# Patient Record
Sex: Female | Born: 1955
Health system: Southern US, Community
[De-identification: ages and names within clinical notes are randomized; demographics above are authoritative.]

## PROBLEM LIST (undated history)

## (undated) DIAGNOSIS — K5792 Diverticulitis of intestine, part unspecified, without perforation or abscess without bleeding: Secondary | ICD-10-CM

## (undated) DIAGNOSIS — E785 Hyperlipidemia, unspecified: Secondary | ICD-10-CM

## (undated) DIAGNOSIS — I10 Essential (primary) hypertension: Secondary | ICD-10-CM

## (undated) DIAGNOSIS — T7840XA Allergy, unspecified, initial encounter: Secondary | ICD-10-CM

## (undated) DIAGNOSIS — M1990 Unspecified osteoarthritis, unspecified site: Secondary | ICD-10-CM

## (undated) DIAGNOSIS — Z860101 Personal history of adenomatous and serrated colon polyps: Secondary | ICD-10-CM

## (undated) DIAGNOSIS — J45909 Unspecified asthma, uncomplicated: Secondary | ICD-10-CM

## (undated) DIAGNOSIS — Z86010 Personal history of colonic polyps: Secondary | ICD-10-CM

## (undated) HISTORY — DX: Essential (primary) hypertension: I10

## (undated) HISTORY — DX: Allergy, unspecified, initial encounter: T78.40XA

## (undated) HISTORY — DX: Diverticulitis of intestine, part unspecified, without perforation or abscess without bleeding: K57.92

## (undated) HISTORY — DX: Unspecified osteoarthritis, unspecified site: M19.90

## (undated) HISTORY — PX: APPENDECTOMY: SHX54

## (undated) HISTORY — PX: TUBAL LIGATION: SHX77

## (undated) HISTORY — DX: Unspecified asthma, uncomplicated: J45.909

## (undated) HISTORY — DX: Hyperlipidemia, unspecified: E78.5

## (undated) HISTORY — DX: Personal history of colonic polyps: Z86.010

## (undated) HISTORY — DX: Personal history of adenomatous and serrated colon polyps: Z86.0101

## (undated) HISTORY — PX: OTHER SURGICAL HISTORY: SHX169

---

## 1993-12-13 HISTORY — PX: ANTERIOR CRUCIATE LIGAMENT REPAIR: SHX115

## 2003-12-14 HISTORY — PX: BREAST BIOPSY: SHX20

## 2004-09-07 ENCOUNTER — Encounter: Admission: RE | Admit: 2004-09-07 | Discharge: 2004-09-07 | Payer: Self-pay | Admitting: Obstetrics and Gynecology

## 2004-09-10 ENCOUNTER — Encounter: Admission: RE | Admit: 2004-09-10 | Discharge: 2004-09-10 | Payer: Self-pay | Admitting: Obstetrics and Gynecology

## 2004-09-14 ENCOUNTER — Encounter (INDEPENDENT_AMBULATORY_CARE_PROVIDER_SITE_OTHER): Payer: Self-pay | Admitting: *Deleted

## 2004-09-14 ENCOUNTER — Encounter: Admission: RE | Admit: 2004-09-14 | Discharge: 2004-09-14 | Payer: Self-pay | Admitting: Obstetrics and Gynecology

## 2005-10-12 ENCOUNTER — Encounter: Admission: RE | Admit: 2005-10-12 | Discharge: 2005-10-12 | Payer: Self-pay | Admitting: Obstetrics and Gynecology

## 2006-10-13 ENCOUNTER — Encounter: Admission: RE | Admit: 2006-10-13 | Discharge: 2006-10-13 | Payer: Self-pay | Admitting: Obstetrics and Gynecology

## 2007-09-10 ENCOUNTER — Emergency Department (HOSPITAL_COMMUNITY): Admission: EM | Admit: 2007-09-10 | Discharge: 2007-09-10 | Payer: Self-pay | Admitting: Emergency Medicine

## 2007-10-18 ENCOUNTER — Encounter: Admission: RE | Admit: 2007-10-18 | Discharge: 2007-10-18 | Payer: Self-pay | Admitting: Obstetrics and Gynecology

## 2008-05-31 ENCOUNTER — Encounter: Admission: RE | Admit: 2008-05-31 | Discharge: 2008-05-31 | Payer: Self-pay | Admitting: Gastroenterology

## 2008-10-23 ENCOUNTER — Encounter: Admission: RE | Admit: 2008-10-23 | Discharge: 2008-10-23 | Payer: Self-pay | Admitting: Obstetrics and Gynecology

## 2009-04-15 ENCOUNTER — Encounter: Payer: Self-pay | Admitting: Internal Medicine

## 2009-05-05 ENCOUNTER — Ambulatory Visit: Payer: Self-pay | Admitting: Internal Medicine

## 2009-05-05 DIAGNOSIS — E785 Hyperlipidemia, unspecified: Secondary | ICD-10-CM | POA: Insufficient documentation

## 2009-05-05 DIAGNOSIS — J309 Allergic rhinitis, unspecified: Secondary | ICD-10-CM | POA: Insufficient documentation

## 2009-05-05 DIAGNOSIS — I10 Essential (primary) hypertension: Secondary | ICD-10-CM | POA: Insufficient documentation

## 2009-05-05 DIAGNOSIS — J45909 Unspecified asthma, uncomplicated: Secondary | ICD-10-CM | POA: Insufficient documentation

## 2009-05-05 DIAGNOSIS — Z860100 Personal history of colon polyps, unspecified: Secondary | ICD-10-CM | POA: Insufficient documentation

## 2009-05-05 LAB — CONVERTED CEMR LAB
Cholesterol, target level: 200 mg/dL
HDL goal, serum: 40 mg/dL
LDL Goal: 160 mg/dL

## 2009-10-30 ENCOUNTER — Encounter: Admission: RE | Admit: 2009-10-30 | Discharge: 2009-10-30 | Payer: Self-pay | Admitting: Internal Medicine

## 2009-12-18 ENCOUNTER — Telehealth (INDEPENDENT_AMBULATORY_CARE_PROVIDER_SITE_OTHER): Payer: Self-pay | Admitting: *Deleted

## 2010-03-17 ENCOUNTER — Ambulatory Visit: Payer: Self-pay | Admitting: Internal Medicine

## 2010-03-17 DIAGNOSIS — K5730 Diverticulosis of large intestine without perforation or abscess without bleeding: Secondary | ICD-10-CM | POA: Insufficient documentation

## 2010-03-24 ENCOUNTER — Ambulatory Visit: Payer: Self-pay | Admitting: Internal Medicine

## 2010-11-03 ENCOUNTER — Encounter: Admission: RE | Admit: 2010-11-03 | Discharge: 2010-11-03 | Payer: Self-pay | Admitting: Internal Medicine

## 2011-01-07 ENCOUNTER — Other Ambulatory Visit: Payer: Self-pay | Admitting: Obstetrics and Gynecology

## 2011-01-10 LAB — CONVERTED CEMR LAB
ALT: 44 units/L — ABNORMAL HIGH (ref 0–35)
AST: 26 units/L (ref 0–37)
Albumin: 4 g/dL (ref 3.5–5.2)
Alkaline Phosphatase: 47 units/L (ref 39–117)
BUN: 10 mg/dL (ref 6–23)
Basophils Absolute: 0 10*3/uL (ref 0.0–0.1)
Basophils Relative: 0.6 % (ref 0.0–3.0)
Bilirubin, Direct: 0.1 mg/dL (ref 0.0–0.3)
CO2: 30 meq/L (ref 19–32)
Calcium: 9.1 mg/dL (ref 8.4–10.5)
Chloride: 101 meq/L (ref 96–112)
Creatinine, Ser: 0.8 mg/dL (ref 0.4–1.2)
Eosinophils Absolute: 0.3 10*3/uL (ref 0.0–0.7)
Eosinophils Relative: 4.4 % (ref 0.0–5.0)
GFR calc non Af Amer: 79.73 mL/min (ref >60)
Glucose, Bld: 100 mg/dL — ABNORMAL HIGH (ref 70–99)
HCT: 41.2 % (ref 36.0–46.0)
Hemoglobin: 14.4 g/dL (ref 12.0–15.0)
Hgb A1c MFr Bld: 5.4 % (ref 4.6–6.5)
Lymphocytes Relative: 15 % (ref 12.0–46.0)
Lymphs Abs: 1.1 10*3/uL (ref 0.7–4.0)
MCHC: 35 g/dL (ref 30.0–36.0)
MCV: 98.1 fL (ref 78.0–100.0)
Monocytes Absolute: 0.6 10*3/uL (ref 0.1–1.0)
Monocytes Relative: 8.5 % (ref 3.0–12.0)
Neutro Abs: 5.2 10*3/uL (ref 1.4–7.7)
Neutrophils Relative %: 71.5 % (ref 43.0–77.0)
Platelets: 347 10*3/uL (ref 150.0–400.0)
Potassium: 4.3 meq/L (ref 3.5–5.1)
RBC: 4.2 M/uL (ref 3.87–5.11)
RDW: 13.6 % (ref 11.5–14.6)
Sodium: 140 meq/L (ref 135–145)
TSH: 2.91 microintl units/mL (ref 0.35–5.50)
Total Bilirubin: 0.6 mg/dL (ref 0.3–1.2)
Total Protein: 7.5 g/dL (ref 6.0–8.3)
WBC: 7.3 10*3/uL (ref 4.5–10.5)

## 2011-01-12 NOTE — Progress Notes (Signed)
Summary: amlodipine refill   Phone Note Refill Request Message from:  Patient on December 18, 2009 10:24 AM  Refills Requested: Medication #1:  AMLODIPINE BESYLATE 5 MG TABS 1 once daily Initial call taken by: Doristine Devoid,  December 18, 2009 10:24 AM    Prescriptions: AMLODIPINE BESYLATE 5 MG TABS (AMLODIPINE BESYLATE) 1 once daily  #30 x 1   Entered by:   Doristine Devoid   Authorized by:   Marga Melnick MD   Signed by:   Doristine Devoid on 12/18/2009   Method used:   Electronically to        [facility]. [phone]* (retail)       [phone]       [place], [place]  [address]       Ph: [phone]       Fax: [phone]   RxID:   [id number]

## 2011-01-12 NOTE — Assessment & Plan Note (Signed)
Summary: CPX///SPH   Vital Signs:  Patient profile:   55 year old female Height:      67.25 inches Weight:      199.2 pounds BMI:     31.08 Temp:     98.9 degrees F oral Pulse rate:   90 / minute Resp:     66 per minute BP sitting:   122 / 70  (left arm) Cuff size:   large  Vitals Entered By: Shonna Chock (March 17, 2010 2:01 PM)  CC: Lipid Management Comments REVIEWED MED LIST, PATIENT AGREED DOSE AND INSTRUCTION CORRECT  * PATIENT STATE TDaP- UTD (UP-TO-DATE)   CC:  Lipid Management.  History of Present Illness: Tina Ryan is here for a wellness checkup; she is asymptomatic except for  mild seasonal RAD symptom. Samples of Qvar have controlled symptoms.   Lipid Management History:      Positive NCEP/ATP III risk factors include hypertension.  Negative NCEP/ATP III risk factors include female age less than 36 years old, no history of early menopause without estrogen hormone replacement, non-diabetic, no family history for ischemic heart disease, non-tobacco-user status, no ASHD (atherosclerotic heart disease), no prior stroke/TIA, no peripheral vascular disease, and no history of aortic aneurysm.     Allergies (verified): No Known Drug Allergies  Past History:  Past Medical History: Allergic rhinitis Asthma, extrinsic (cat trigger) 1990 Hyperlipidemia: TC 286, TG 76,HDL 74.4, LDL 196 in 2004. Hypertension Colonic polyps, adenomatous ,PMH of, Dr Matthias Hughs 2007 Diverticulosis, colon  Past Surgical History: G3 P3 ( 2 C sections);  ACL L knee, Dr Thomasena Edis Appendectomy; Breast biopsy 2005 for lesion which  self resolved; Colon polypectomy  2007, Dr Matthias Hughs , due 2012  Family History: Father:Retinitis Pigmentosa,colon cancer; Mother:skin cancer, HTN,lipids, breast cancer,2 primary  lung cancers, remote smoker,hypothyroidism Siblings: brother  hypothyroidism,VSD ,S/P bovine  aortic root transplant, AF with intra atrial clot; Maternal FH HTN, skin cancer   Social  History: Never Smoked Alcohol use-yes:socially Regular exercise-yes: tennis, walking Single  Review of Systems  The patient denies anorexia, fever, weight loss, weight gain, vision loss, decreased hearing, chest pain, syncope, dyspnea on exertion, peripheral edema, headaches, abdominal pain, melena, hematochezia, severe indigestion/heartburn, hematuria, suspicious skin lesions, depression, unusual weight change, abnormal bleeding, enlarged lymph nodes, and angioedema.   Resp:  Complains of cough and wheezing; denies shortness of breath and sputum productive; Minor dry cough with ACE-I.  Physical Exam  General:  well-nourished; alert,appropriate and cooperative throughout examination Head:  Normocephalic and atraumatic without obvious abnormalities.  Eyes:  No corneal or conjunctival inflammation noted. Perrla. Funduscopic exam benign, without hemorrhages, exudates or papilledema. Ears:  External ear exam shows no significant lesions or deformities.  Otoscopic examination reveals clear canals, tympanic membranes are intact bilaterally without bulging, retraction, inflammation or discharge. Hearing is grossly normal bilaterally. Nose:  External nasal examination shows no deformity or inflammation. Nasal mucosa are pink and moist without lesions or exudates. Mouth:  Oral mucosa and oropharynx without lesions or exudates.  Teeth in good repair. Neck:  No deformities, masses, or tenderness noted. Lungs:  Normal respiratory effort, chest expands symmetrically. Lungs are clear to auscultation except for minor rales;no  wheezes. Heart:  Normal rate and regular rhythm. S1 and S2 normal without gallop, murmur, click, rub.S4 Abdomen:  Bowel sounds positive,abdomen soft and non-tender without masses, organomegaly or hernias noted. Msk:  No deformity or scoliosis noted of thoracic or lumbar spine.   Pulses:  R and L carotid,radial,dorsalis pedis and posterior tibial pulses are  full and equal  bilaterally Extremities:  No clubbing, cyanosis, edema, or deformity noted with normal full range of motion of all joints.  MINOR , isolated DIP DJD of hands Neurologic:  alert & oriented X3 and DTRs symmetrical and normal.   Skin:  Intact without suspicious lesions or rashes Cervical Nodes:  No lymphadenopathy noted Axillary Nodes:  No palpable lymphadenopathy Psych:  memory intact for recent and remote, normally interactive, and good eye contact.     Impression & Recommendations:  Problem # 1:  ROUTINE GENERAL MEDICAL EXAM@HEALTH  CARE FACL (ICD-V70.0)  Orders: EKG w/ Interpretation (93000)  Problem # 2:  ASTHMA (ICD-493.90) Seasonal flare Her updated medication list for this problem includes:    Qvar 80 Mcg/act Aers (Beclomethasone dipropionate) .Marland Kitchen... 1-2 puffs every 12 hrs ; gargle  after use  Problem # 3:  HYPERTENSION (ICD-401.9)  Her updated medication list for this problem includes:    Lisinopril 20 Mg Tabs (Lisinopril) .Marland Kitchen... 1 once daily    Amlodipine Besylate 5 Mg Tabs (Amlodipine besylate) .Marland Kitchen... 1 once daily  Orders: EKG w/ Interpretation (93000)  Problem # 4:  HYPERLIPIDEMIA (ICD-272.4)  Problem # 5:  COLONIC POLYPS, HX OF (ICD-V12.72) recheck due 2012  Complete Medication List: 1)  Lisinopril 20 Mg Tabs (Lisinopril) .Marland Kitchen.. 1 once daily 2)  Amlodipine Besylate 5 Mg Tabs (Amlodipine besylate) .Marland Kitchen.. 1 once daily 3)  Qvar 80 Mcg/act Aers (Beclomethasone dipropionate) .Marland Kitchen.. 1-2 puffs every 12 hrs ; gargle  after use  Lipid Assessment/Plan:      Based on NCEP/ATP III, the patient's risk factor category is "0-1 risk factors".  The patient's lipid goals are as follows: Total cholesterol goal is 200; LDL cholesterol goal is 160; HDL cholesterol goal is 40; Triglyceride goal is 150.  Her LDL cholesterol goal has not been met.  Secondary causes for hyperlipidemia have been ruled out.  She has been counseled on adjunctive measures for lowering her cholesterol and has been  provided with dietary instructions.    Patient Instructions: 1)  Schedule fasting labs ( Codes : V70.0, 272.4, 995.20, 401.9): 2)  BMP ; 3)  Hepatic Panel ; 4)  NMR Lipoprofile Lipid Panel ; 5)  TSH ; 6)  CBC w/ Diff . Prescriptions: AMLODIPINE BESYLATE 5 MG TABS (AMLODIPINE BESYLATE) 1 once daily  #90 x 3   Entered and Authorized by:   Marga Melnick MD   Signed by:   Marga Melnick MD on 03/17/2010   Method used:   Faxed to ...       [facility]  [address]  [phone]* (retail)       [address]       [place], [place]  [address]       Ph: [phone] or [phone]       Fax: [phone]   RxID:   [id number] LISINOPRIL 20 MG TABS (LISINOPRIL) 1 once daily  #90 x 3   Entered and Authorized by:   Marga Melnick MD   Signed by:   Marga Melnick MD on 03/17/2010   Method used:   Faxed to ...       [facility]  [address]  [phone]* (retail)       [address]       [place], [place]  [address]       Ph: [phone] or [phone]       Fax: [phone]   RxID:  [id number]  

## 2011-05-14 ENCOUNTER — Telehealth: Payer: Self-pay | Admitting: Internal Medicine

## 2011-05-14 MED ORDER — AMLODIPINE BESYLATE 5 MG PO TABS: 5.0000 mg | ORAL_TABLET | Freq: Every day | ORAL | Status: DC

## 2011-05-14 MED ORDER — LISINOPRIL 20 MG PO TABS: 20.0000 mg | ORAL_TABLET | Freq: Every day | ORAL | Status: DC

## 2011-05-14 NOTE — Telephone Encounter (Signed)
meds sent in

## 2011-08-13 ENCOUNTER — Other Ambulatory Visit: Payer: Self-pay | Admitting: Internal Medicine

## 2011-08-13 ENCOUNTER — Encounter: Payer: Self-pay | Admitting: Internal Medicine

## 2011-08-13 ENCOUNTER — Ambulatory Visit (INDEPENDENT_AMBULATORY_CARE_PROVIDER_SITE_OTHER): Payer: BC Managed Care – PPO | Admitting: Internal Medicine

## 2011-08-13 VITALS — BP 140/82 | HR 96 | Temp 98.5°F | Resp 18 | Ht 67.0 in | Wt 201.0 lb

## 2011-08-13 DIAGNOSIS — D126 Benign neoplasm of colon, unspecified: Secondary | ICD-10-CM

## 2011-08-13 DIAGNOSIS — Z0000 Encounter for general adult medical examination without abnormal findings: Secondary | ICD-10-CM

## 2011-08-13 DIAGNOSIS — E785 Hyperlipidemia, unspecified: Secondary | ICD-10-CM

## 2011-08-13 DIAGNOSIS — T887XXA Unspecified adverse effect of drug or medicament, initial encounter: Secondary | ICD-10-CM

## 2011-08-13 DIAGNOSIS — Z86010 Personal history of colonic polyps: Secondary | ICD-10-CM

## 2011-08-13 DIAGNOSIS — I10 Essential (primary) hypertension: Secondary | ICD-10-CM

## 2011-08-13 MED ORDER — LOSARTAN POTASSIUM 100 MG PO TABS: 100.0000 mg | ORAL_TABLET | Freq: Every day | ORAL | Status: DC

## 2011-08-13 MED ORDER — AMLODIPINE BESYLATE 5 MG PO TABS: 5.0000 mg | ORAL_TABLET | Freq: Every day | ORAL | Status: DC

## 2011-08-13 NOTE — Patient Instructions (Signed)
Preventive Health Care: Exercise  30-45  minutes a day, 3-4 days a week. Walking is especially valuable in preventing Osteoporosis. Eat a low-fat diet with lots of fruits and vegetables, up to 7-9 servings per day. Consume less than 30 grams of sugar per day from foods & drinks with High Fructose Corn Syrup as # 1,2,3 or #4 on label. Health Care Power of Attorney & Living Will place you in charge of your health care  decisions. Verify these are  in place. Blood Pressure Goal  Ideally is an AVERAGE < 135/85. This AVERAGE should be calculated from @ least 5-7 BP readings taken @ different times of day on different days of week. You should not respond to isolated BP readings , but rather the AVERAGE for that week  Please  schedule fasting Labs : BMET,Lipids, hepatic panel, CBC & dif, TSH ( 401.9, 272.4, 211.3, 995.20)

## 2011-08-13 NOTE — Progress Notes (Signed)
Subjective:    Patient ID: Tina Ryan, female    DOB: Jul 08, 1956, 55 y.o.   MRN: [id number]  HPI  Tina Ryan  is here for a physical; acute issues include flare  of DJD of L knee      Review of Systems Patient reports no vision/ hearing  changes, adenopathy,fever, weight change,  persistant / recurrent hoarseness , swallowing issues, chest pain,palpitations,edema,persistant /recurrent cough, hemoptysis, dyspnea( rest/ exertional/paroxysmal nocturnal), gastrointestinal bleeding(melena, rectal bleeding), abdominal pain, significant heartburn,  bowel changes,GU symptoms(dysuria, hematuria,pyuria, incontinence) ), Gyn symptoms(abnormal  bleeding , pain),  syncope, focal weakness, memory loss,numbness & tingling, skin/hair /nail changes,abnormal bruising or bleeding, anxiety,or depression.    She's been using ice and nonsteroidals for the left knee pain.  Ramapril controls her blood pressure it is associated with a dry cough.    Objective:   Physical Exam Gen.: Healthy and well-nourished in appearance. Alert, appropriate and cooperative throughout exam. Head: Normocephalic without obvious abnormalities. Eyes: No corneal or conjunctival inflammation noted. Pupils equal round reactive to light and accommodation. Fundal exam is benign without hemorrhages, exudate, papilledema. Extraocular motion intact. Vision grossly normal. Ears: External  ear exam reveals no significant lesions or deformities. Canals clear .TMs normal. Hearing is grossly normal bilaterally. Nose: External nasal exam reveals no deformity or inflammation. Nasal mucosa are pink and moist. No lesions or exudates noted. Septum  Slightly dislocated to L  Mouth: Oral mucosa and oropharynx reveal no lesions or exudates. Teeth in good repair. Neck: No deformities, masses, or tenderness noted. Range of motion &. Thyroid  normal. Lungs: Normal respiratory effort; chest expands symmetrically. Lungs are clear to auscultation without rales,  wheezes, or increased work of breathing. Heart: Normal rate and rhythm. Normal S1 and S2. No gallop, click, or rub. S4  murmur. Abdomen: Bowel sounds normal; abdomen soft and nontender. No masses, organomegaly or hernias noted. Genitalia: Dr Edward Jolly   .                                                                                   Musculoskeletal/extremities: No deformity or scoliosis noted of  the thoracic or lumbar spine. No clubbing, cyanosis, edema, or deformity noted. Range of motion slightly decreased L knee.Tone & strength  normal.Joints normal. Nail health  Good.Creitus L knee Vascular: Carotid, radial artery, dorsalis pedis and  posterior tibial pulses are full and equal. No bruits present. Neurologic: Alert and oriented x3. Deep tendon reflexes symmetrical and normal.          Skin: Intact without suspicious lesions or rashes. Lymph: No cervical, axillary  lymphadenopathy present. Psych: Mood and affect are normal. Normally interactive                                                                                         Assessment & Plan:  #1 comprehensive  physical exam; no acute findings #2 see Problem List with Assessments & Recommendations #3 DJD L knee Plan: see Orders

## 2011-08-19 ENCOUNTER — Other Ambulatory Visit: Payer: BC Managed Care – PPO

## 2011-08-19 ENCOUNTER — Other Ambulatory Visit: Payer: Self-pay | Admitting: Internal Medicine

## 2011-08-19 ENCOUNTER — Other Ambulatory Visit (INDEPENDENT_AMBULATORY_CARE_PROVIDER_SITE_OTHER): Payer: BC Managed Care – PPO

## 2011-08-19 DIAGNOSIS — E785 Hyperlipidemia, unspecified: Secondary | ICD-10-CM

## 2011-08-19 DIAGNOSIS — D126 Benign neoplasm of colon, unspecified: Secondary | ICD-10-CM

## 2011-08-19 DIAGNOSIS — I10 Essential (primary) hypertension: Secondary | ICD-10-CM

## 2011-08-19 DIAGNOSIS — T887XXA Unspecified adverse effect of drug or medicament, initial encounter: Secondary | ICD-10-CM

## 2011-08-19 LAB — CBC WITH DIFFERENTIAL/PLATELET
Basophils Absolute: 0.1 10*3/uL (ref 0.0–0.1)
Basophils Relative: 0.8 % (ref 0.0–3.0)
Eosinophils Absolute: 0.3 10*3/uL (ref 0.0–0.7)
Eosinophils Relative: 5.3 % — ABNORMAL HIGH (ref 0.0–5.0)
HCT: 42.8 % (ref 36.0–46.0)
Hemoglobin: 14.4 g/dL (ref 12.0–15.0)
Lymphocytes Relative: 19.2 % (ref 12.0–46.0)
Lymphs Abs: 1.2 10*3/uL (ref 0.7–4.0)
MCHC: 33.7 g/dL (ref 30.0–36.0)
MCV: 97.3 fl (ref 78.0–100.0)
Monocytes Absolute: 0.6 10*3/uL (ref 0.1–1.0)
Monocytes Relative: 9.7 % (ref 3.0–12.0)
Neutro Abs: 4.1 10*3/uL (ref 1.4–7.7)
Neutrophils Relative %: 65 % (ref 43.0–77.0)
Platelets: 338 10*3/uL (ref 150.0–400.0)
RBC: 4.4 Mil/uL (ref 3.87–5.11)
RDW: 13.3 % (ref 11.5–14.6)
WBC: 6.3 10*3/uL (ref 4.5–10.5)

## 2011-08-19 LAB — BASIC METABOLIC PANEL
BUN: 13 mg/dL (ref 6–23)
CO2: 26 mEq/L (ref 19–32)
Calcium: 9.5 mg/dL (ref 8.4–10.5)
Creatinine, Ser: 0.9 mg/dL (ref 0.4–1.2)
GFR: 73.95 mL/min (ref >60.00)
Glucose, Bld: 109 mg/dL — ABNORMAL HIGH (ref 70–99)

## 2011-08-19 LAB — HEPATIC FUNCTION PANEL
ALT: 43 U/L — ABNORMAL HIGH (ref 0–35)
AST: 25 U/L (ref 0–37)
Albumin: 4.1 g/dL (ref 3.5–5.2)
Alkaline Phosphatase: 55 U/L (ref 39–117)
Bilirubin, Direct: 0.1 mg/dL (ref 0.0–0.3)
Total Bilirubin: 0.9 mg/dL (ref 0.3–1.2)
Total Protein: 7.3 g/dL (ref 6.0–8.3)

## 2011-08-19 LAB — LIPID PANEL
Cholesterol: 307 mg/dL — ABNORMAL HIGH (ref 0–200)
HDL: 69.8 mg/dL (ref >39.00)
Triglycerides: 145 mg/dL (ref 0.0–149.0)

## 2011-08-19 LAB — TSH: TSH: 3.15 u[IU]/mL (ref 0.35–5.50)

## 2011-08-21 ENCOUNTER — Encounter: Payer: Self-pay | Admitting: Internal Medicine

## 2011-08-26 ENCOUNTER — Telehealth: Payer: Self-pay

## 2011-08-26 MED ORDER — PRAVASTATIN SODIUM 40 MG PO TABS: 40.0000 mg | ORAL_TABLET | Freq: Every day | ORAL | Status: DC

## 2011-08-26 NOTE — Telephone Encounter (Signed)
Labs and rx mailed to patient  

## 2011-08-26 NOTE — Telephone Encounter (Signed)
Message copied by Edgardo Roys on Thu Aug 26, 2011  1:07 PM ------      Message from: Pecola Lawless      Created: Sat Aug 21, 2011  4:49 PM       Risk of premature heart attack or stroke increases as LDL or BAD cholesterol rises.Advanced cholesterol panels optimally determine risk base on particle composition ( NMR Lipoprofile ) or by assessing multiple other genetic risks(Boston Heart Panel 1304X). These are indicated when LDL is > 130, especially if there is family history of heart attack in males before 56 or women before 55  Please review Dr Gildardo Griffes book Eat, Drink & Be Healthy for dietary cholesterol information. Based on your NMR your  LDL  Minimal goal = < 110. Please consider pravastatin 40 mg, 1/2 pill  at bedtime with repeat fasting labs in 10 weeks (lipids, AST, ALT, CK; 272.4, 995.20). This medicine would cost $10 for 90 pills at [facility] or  [facility], if insurance not used. [employer]

## 2011-10-07 ENCOUNTER — Other Ambulatory Visit: Payer: Self-pay | Admitting: Internal Medicine

## 2011-10-07 DIAGNOSIS — Z803 Family history of malignant neoplasm of breast: Secondary | ICD-10-CM

## 2011-10-07 DIAGNOSIS — Z1231 Encounter for screening mammogram for malignant neoplasm of breast: Secondary | ICD-10-CM

## 2011-11-08 ENCOUNTER — Ambulatory Visit
Admission: RE | Admit: 2011-11-08 | Discharge: 2011-11-08 | Disposition: A | Discharge status: Home / Self Care | Payer: BC Managed Care – PPO | Source: Ambulatory Visit | Attending: Internal Medicine | Admitting: Internal Medicine

## 2011-11-08 DIAGNOSIS — Z1231 Encounter for screening mammogram for malignant neoplasm of breast: Secondary | ICD-10-CM

## 2011-11-08 DIAGNOSIS — Z803 Family history of malignant neoplasm of breast: Secondary | ICD-10-CM

## 2012-02-11 ENCOUNTER — Other Ambulatory Visit: Payer: Self-pay | Admitting: Internal Medicine

## 2012-02-11 NOTE — Telephone Encounter (Signed)
Prescription sent to pharmacy.

## 2012-06-09 ENCOUNTER — Other Ambulatory Visit: Payer: Self-pay | Admitting: Gastroenterology

## 2012-07-03 ENCOUNTER — Encounter: Payer: Self-pay | Admitting: Internal Medicine

## 2012-07-17 ENCOUNTER — Other Ambulatory Visit: Payer: Self-pay | Admitting: Internal Medicine

## 2012-07-17 NOTE — Telephone Encounter (Signed)
Patient needs a CPX

## 2012-10-16 ENCOUNTER — Other Ambulatory Visit: Payer: Self-pay | Admitting: Internal Medicine

## 2012-10-16 DIAGNOSIS — Z1231 Encounter for screening mammogram for malignant neoplasm of breast: Secondary | ICD-10-CM

## 2012-11-10 ENCOUNTER — Inpatient Hospital Stay: Admission: RE | Admit: 2012-11-10 | Payer: BC Managed Care – PPO | Source: Ambulatory Visit

## 2012-11-13 ENCOUNTER — Other Ambulatory Visit: Payer: Self-pay | Admitting: Internal Medicine

## 2012-11-14 ENCOUNTER — Ambulatory Visit
Admission: RE | Admit: 2012-11-14 | Discharge: 2012-11-14 | Disposition: A | Discharge status: Home / Self Care | Payer: BC Managed Care – PPO | Source: Ambulatory Visit | Attending: Internal Medicine | Admitting: Internal Medicine

## 2012-11-14 DIAGNOSIS — Z1231 Encounter for screening mammogram for malignant neoplasm of breast: Secondary | ICD-10-CM

## 2013-01-16 ENCOUNTER — Ambulatory Visit (INDEPENDENT_AMBULATORY_CARE_PROVIDER_SITE_OTHER): Payer: BC Managed Care – PPO | Admitting: Internal Medicine

## 2013-01-16 ENCOUNTER — Encounter: Payer: Self-pay | Admitting: Internal Medicine

## 2013-01-16 VITALS — BP 130/82 | HR 108 | Temp 98.3°F | Ht 67.0 in | Wt 200.0 lb

## 2013-01-16 DIAGNOSIS — J309 Allergic rhinitis, unspecified: Secondary | ICD-10-CM

## 2013-01-16 DIAGNOSIS — J45909 Unspecified asthma, uncomplicated: Secondary | ICD-10-CM

## 2013-01-16 DIAGNOSIS — Z0000 Encounter for general adult medical examination without abnormal findings: Secondary | ICD-10-CM

## 2013-01-16 DIAGNOSIS — Z23 Encounter for immunization: Secondary | ICD-10-CM

## 2013-01-16 DIAGNOSIS — I10 Essential (primary) hypertension: Secondary | ICD-10-CM

## 2013-01-16 DIAGNOSIS — Z86010 Personal history of colonic polyps: Secondary | ICD-10-CM

## 2013-01-16 DIAGNOSIS — E785 Hyperlipidemia, unspecified: Secondary | ICD-10-CM

## 2013-01-16 DIAGNOSIS — R9431 Abnormal electrocardiogram [ECG] [EKG]: Secondary | ICD-10-CM | POA: Insufficient documentation

## 2013-01-16 MED ORDER — AMLODIPINE BESYLATE 5 MG PO TABS: 5.0000 mg | ORAL_TABLET | Freq: Every day | ORAL | Status: DC

## 2013-01-16 NOTE — Addendum Note (Signed)
Addended by: Maurice Small on: 01/16/2013 02:24 PM   Modules accepted: Orders

## 2013-01-16 NOTE — Progress Notes (Signed)
  Subjective:    Patient ID: Tina Ryan, female    DOB: [birth year], 57 y.o.   MRN: [id number]  HPI  Tina Ryan  is here for a physical;she has an active RTI after caring for grandson.      Review of Systems The respiratory tract symptoms began  01/12/13 as sore throat , rhinitis, head congestion w/o chest congestion or  cough. Symptoms not present include frontal headache, facial pain, dental pain, sore throat, nasal purulence, earache , and otic discharge. Cough is dry & limited to am & is not associated with  shortness of breath and wheezing . Fever,chills & sweats not present . Extrinsic symptoms of  sneezing  present .  Myalgias and arthralgias were not present. Flu shot current. Treatment with  Neti pot, NSAIDS,Mucinex DM was partially effective. There has been no  asthma flare.                  Objective:   Physical Exam Gen.:  well-nourished in appearance. Alert, appropriate and cooperative throughout exam.   Head: Normocephalic without obvious abnormalities  Eyes: No corneal or conjunctival inflammation noted. Pupils equal round reactive to light and accommodation. Fundal exam is benign without hemorrhages, exudate, papilledema. Extraocular motion intact. Vision grossly normal. Ears: External  ear exam reveals no significant lesions or deformities. Canals clear .TMs normal. Hearing is grossly normal bilaterally. Nose: External nasal exam reveals no deformity or inflammation. Nasal mucosa are pink and moist. No lesions or exudates noted.  Mouth: Oral mucosa and oropharynx reveal no lesions or exudates. Teeth in good repair. Neck: No deformities, masses, or tenderness noted. Range of motion & Thyroid normal. Lungs: Normal respiratory effort; chest expands symmetrically. Lungs are clear to auscultation without rales, wheezes, or increased work of breathing. Heart: Normal rate and rhythm. Normal S1 and S2. S4  gallop, click, or rub. No murmur. Abdomen: Bowel sounds normal;  abdomen soft and nontender. No masses, organomegaly or hernias noted. Genitalia: [employer] [facility]                                    Musculoskeletal/extremities: No definite scoliosis noted of  the thoracic  spine. No clubbing, cyanosis, edema, or significant extremity  deformity noted. Range of motion normal .Tone & strength  Normal.Minor DIP changes . Nail health good. Able to lie down & sit up w/o help. Negative SLR bilaterally Vascular: Carotid, radial artery, dorsalis pedis and  posterior tibial pulses are full and equal. No bruits present. Neurologic: Alert and oriented x3. Deep tendon reflexes symmetrical and normal.  Skin: Intact without suspicious lesions or rashes. Lymph: No cervical, axillary lymphadenopathy present. Psych: Mood and affect are normal. Normally interactive                                                                                        Assessment & Plan:  #1 comprehensive physical exam; no acute findings  Plan: see Orders  & Recommendations

## 2013-01-16 NOTE — Patient Instructions (Addendum)
Preventive Health Care: Exercise  30-45  minutes a day, 3-4 days a week. Walking is especially valuable in preventing Osteoporosis. Eat a low-fat diet with lots of fruits and vegetables, up to 7-9 servings per day. This would eliminate need for vitamin supplements for most individuals. Consume less than 30 grams of sugar per day from foods & drinks with High Fructose Corn Syrup as #1,2,3 or #4 on label. The legal document "Health Care Power of Attorney & Living Will " verifies decisions concerning your health care. Take the EKG to any emergency room or preop visits. There are nonspecific changes; as long as there is no new change these are not clinically significant . If the old EKG is not available for comparison; it may result in unnecessary hospitalization for observation with significant unnecessary expense.  Minimal Blood Pressure Goal= AVERAGE < 140/90;  Ideal is an AVERAGE < 135/85. This AVERAGE should be calculated from @ least 5-7 BP readings taken @ different times of day on different days of week. You should not respond to isolated BP readings , but rather the AVERAGE for that week .Please bring your  blood pressure cuff to office visits to verify that it is reliable.It  can also be checked against the blood pressure device at the pharmacy. Finger or wrist cuffs are not dependable; an arm cuff is.   Please  schedule fasting Labs : BMET,Lipids, hepatic panel,  TSH.  PLEASE BRING THESE INSTRUCTIONS TO FOLLOW UP  LAB APPOINTMENT.This will guarantee correct labs are drawn, eliminating need for repeat blood sampling ( needle sticks ! ). Diagnoses /Codes: V70.0, 401.9,272.4.  If you activate My Chart; the results can be released to you as soon as they populate from the lab. If you choose not to use this program; the labs have to be reviewed, copied & mailed   causing a delay in getting the results to you.

## 2013-02-09 ENCOUNTER — Other Ambulatory Visit (INDEPENDENT_AMBULATORY_CARE_PROVIDER_SITE_OTHER): Payer: BC Managed Care – PPO

## 2013-02-09 DIAGNOSIS — Z0000 Encounter for general adult medical examination without abnormal findings: Secondary | ICD-10-CM

## 2013-02-09 DIAGNOSIS — E785 Hyperlipidemia, unspecified: Secondary | ICD-10-CM

## 2013-02-09 DIAGNOSIS — I10 Essential (primary) hypertension: Secondary | ICD-10-CM

## 2013-02-09 LAB — LIPID PANEL: Total CHOL/HDL Ratio: 3

## 2013-02-09 LAB — HEPATIC FUNCTION PANEL
ALT: 52 U/L — ABNORMAL HIGH (ref 0–35)
AST: 34 U/L (ref 0–37)
Alkaline Phosphatase: 60 U/L (ref 39–117)
Total Bilirubin: 0.8 mg/dL (ref 0.3–1.2)

## 2013-02-09 LAB — BASIC METABOLIC PANEL
BUN: 10 mg/dL (ref 6–23)
CO2: 29 mEq/L (ref 19–32)
Calcium: 9.6 mg/dL (ref 8.4–10.5)
Creatinine, Ser: 0.8 mg/dL (ref 0.4–1.2)
GFR: 74.56 mL/min (ref >60.00)
Glucose, Bld: 105 mg/dL — ABNORMAL HIGH (ref 70–99)

## 2013-02-23 ENCOUNTER — Other Ambulatory Visit: Payer: Self-pay

## 2013-06-06 ENCOUNTER — Telehealth: Payer: Self-pay | Admitting: *Deleted

## 2013-06-06 NOTE — Telephone Encounter (Signed)
Atorvastatin 10 mg #90 

## 2013-06-06 NOTE — Telephone Encounter (Signed)
Pt left VM that she received a e-mail about changing to simvastatin or Lipitor and if price was a issue. Pt return call to report that cost is not a issue but would like for [facility] to pick a med that has less side effect. Pt notes that some med cause muscle pain. Marland KitchenPlease advise

## 2013-06-07 ENCOUNTER — Other Ambulatory Visit: Payer: Self-pay | Admitting: *Deleted

## 2013-06-07 MED ORDER — ATORVASTATIN CALCIUM 10 MG PO TABS: 10.0000 mg | ORAL_TABLET | Freq: Every day | ORAL | Status: DC

## 2013-06-07 NOTE — Telephone Encounter (Signed)
Discuss with patient, Rx sent. 

## 2013-07-04 ENCOUNTER — Other Ambulatory Visit: Payer: Self-pay | Admitting: *Deleted

## 2013-07-04 ENCOUNTER — Telehealth: Payer: Self-pay | Admitting: *Deleted

## 2013-07-06 ENCOUNTER — Other Ambulatory Visit: Payer: Self-pay

## 2013-07-06 ENCOUNTER — Other Ambulatory Visit: Payer: Self-pay | Admitting: *Deleted

## 2013-07-06 MED ORDER — LOSARTAN POTASSIUM 50 MG PO TABS: 50.0000 mg | ORAL_TABLET | Freq: Every day | ORAL | Status: DC

## 2013-07-06 MED ORDER — AMLODIPINE BESYLATE 5 MG PO TABS: 5.0000 mg | ORAL_TABLET | Freq: Every day | ORAL | Status: DC

## 2013-07-06 MED ORDER — ATORVASTATIN CALCIUM 10 MG PO TABS: 10.0000 mg | ORAL_TABLET | Freq: Every day | ORAL | Status: DC

## 2013-07-06 NOTE — Telephone Encounter (Signed)
Patient called and indicated that she is taking Losartan 100 mg (1/2 by mouth daily), patient would like rx written for 50 mg, pill is hard to half. Patient's insurance is requesting that she use mail order company

## 2013-07-06 NOTE — Telephone Encounter (Signed)
Paper fax received from [employer], mail order company. Request was for Lipitor, Losartan and amlodipine. Message left on VM for patient to call and confirm that requested [place]email send [place] this request, patient also to confirm she is still taking Losartan for it does not have a recent fill history

## 2013-07-17 NOTE — Telephone Encounter (Signed)
error 

## 2013-10-05 ENCOUNTER — Other Ambulatory Visit: Payer: Self-pay

## 2013-10-05 ENCOUNTER — Other Ambulatory Visit: Payer: Self-pay | Admitting: Dermatology

## 2013-10-05 DIAGNOSIS — Z1231 Encounter for screening mammogram for malignant neoplasm of breast: Secondary | ICD-10-CM

## 2013-10-18 ENCOUNTER — Other Ambulatory Visit: Payer: Self-pay

## 2013-11-20 ENCOUNTER — Ambulatory Visit: Payer: BC Managed Care – PPO

## 2013-11-20 ENCOUNTER — Ambulatory Visit
Admission: RE | Admit: 2013-11-20 | Discharge: 2013-11-20 | Disposition: A | Discharge status: Home / Self Care | Payer: BC Managed Care – PPO | Source: Ambulatory Visit

## 2013-11-20 DIAGNOSIS — Z1231 Encounter for screening mammogram for malignant neoplasm of breast: Secondary | ICD-10-CM

## 2014-01-03 ENCOUNTER — Ambulatory Visit (INDEPENDENT_AMBULATORY_CARE_PROVIDER_SITE_OTHER): Payer: BC Managed Care – PPO | Admitting: Physician Assistant

## 2014-01-03 VITALS — BP 126/82 | HR 87 | Temp 98.2°F | Resp 16 | Wt 188.0 lb

## 2014-01-03 DIAGNOSIS — J4521 Mild intermittent asthma with (acute) exacerbation: Secondary | ICD-10-CM

## 2014-01-03 DIAGNOSIS — J45901 Unspecified asthma with (acute) exacerbation: Secondary | ICD-10-CM

## 2014-01-03 DIAGNOSIS — J209 Acute bronchitis, unspecified: Secondary | ICD-10-CM

## 2014-01-03 MED ORDER — IPRATROPIUM-ALBUTEROL 0.5-2.5 (3) MG/3ML IN SOLN: 3.0000 mL | Freq: Once | RESPIRATORY_TRACT | Status: DC

## 2014-01-03 MED ORDER — METHYLPREDNISOLONE (PAK) 4 MG PO TABS: ORAL_TABLET | ORAL | Status: DC

## 2014-01-03 MED ORDER — FLUTICASONE-SALMETEROL 100-50 MCG/DOSE IN AEPB: 1.0000 | INHALATION_SPRAY | Freq: Two times a day (BID) | RESPIRATORY_TRACT | Status: DC

## 2014-01-03 MED ORDER — AZITHROMYCIN 250 MG PO TABS: ORAL_TABLET | ORAL | Status: DC

## 2014-01-03 NOTE — Progress Notes (Signed)
Patient presents to clinic today c/o cough productive of yellow sputum, shortness of breath and wheezing. Patient states she was evaluated at [facility] and was given a prescription for Augmentin. Patient states she has not noticed an improvement in her symptoms. Patient does have history of asthma, for which she has an albuterol rescue inhaler. Patient states that before her symptom onset, she had not used her inhaler in almost a year. States that her asthma is usually allergy induced or secondary to a respiratory infection. Patient is unsure fever. Denies chills, malaise or fatigue.  Past Medical History  Diagnosis Date  . Hyperlipidemia   . Hypertension   . Hx of adenomatous colonic polyps 2004 & 2007  . Diverticulitis 2009 & 2011  . Allergy     seasonal & animal triggers  . Asthma     Current Outpatient Prescriptions on File Prior to Visit  Medication Sig Dispense Refill  . amLODipine (NORVASC) 5 MG tablet Take 1 tablet (5 mg total) by mouth daily.  90 tablet  1  . atorvastatin (LIPITOR) 10 MG tablet Take 1 tablet (10 mg total) by mouth daily.  90 tablet  1  . losartan (COZAAR) 50 MG tablet Take 50 mg by mouth daily.      Marland Kitchen losartan (COZAAR) 50 MG tablet Take 1 tablet (50 mg total) by mouth daily.  90 tablet  1   No current facility-administered medications on file prior to visit.    Allergies  Allergen Reactions  . Codeine     Vomitting  . Lisinopril     cough    Family History  Problem Relation Age of Onset  . Colon cancer Father   . Heart attack Maternal Aunt 46  . Heart attack Paternal Aunt 51    Bullous Pemphigoid; Hamman - Rich Syndrome  . Hyperlipidemia      5 M aunts  . Hyperlipidemia Mother   . Thyroid disease      4 M aunts  . GER disease Son   . Stroke Maternal Grandmother   . Diabetes Neg Hx   . Hypertension      multiple M aunts   . Breast cancer      97 M aunts  . Breast cancer Mother   . Lung cancer Mother     2 lobectomies ; radiation     History   Social History  . Marital Status: Single    Spouse Name: N/A    Number of Children: N/A  . Years of Education: N/A   Social History Main Topics  . Smoking status: Never Smoker   . Smokeless tobacco: Not on file  . Alcohol Use: 3.0 oz/week    5 Glasses of wine per week     Comment:  socially  . Drug Use: No  . Sexual Activity: Not on file   Other Topics Concern  . Not on file   Social History Narrative  . No narrative on file   Review of Systems - See HPI.  All other ROS are negative.  Filed Vitals:   01/03/14 1515  BP: 126/82  Pulse: 87  Temp: 98.2 F (36.8 C)  Resp: 16   Physical Exam  Vitals reviewed. Constitutional: She is oriented to person, place, and time and well-developed, well-nourished, and in no distress.  HENT:  Head: Normocephalic and atraumatic.  Right Ear: External ear normal.  Left Ear: External ear normal.  Nose: Nose normal.  Mouth/Throat: Oropharynx is clear and moist. No  oropharyngeal exudate.  Tympanic membranes within normal limits bilaterally. No tenderness to percussion of sinuses noted on examination.  Eyes: Conjunctivae are normal. Pupils are equal, round, and reactive to light.  Neck: Neck supple.  Cardiovascular: Normal rate, regular rhythm, normal heart sounds and intact distal pulses.   Pulmonary/Chest: Effort normal. No respiratory distress. She has wheezes. She has no rales.  Lymphadenopathy:    She has no cervical adenopathy.  Neurological: She is alert and oriented to person, place, and time.  Skin: Skin is warm and dry. No rash noted.  Psychiatric: Affect normal.   Assessment/Plan: No problem-specific assessment & plan notes found for this encounter.

## 2014-01-03 NOTE — Patient Instructions (Signed)
Take medications as prescribed.  Increase fluid intake.  Rest.  Place a humidifier in the bedroom.  Continue Albuterol inhaler as needed.  Please call or return to clinic if symptoms not improving.

## 2014-01-04 ENCOUNTER — Encounter: Payer: Self-pay | Admitting: Physician Assistant

## 2014-01-04 DIAGNOSIS — J4521 Mild intermittent asthma with (acute) exacerbation: Secondary | ICD-10-CM | POA: Insufficient documentation

## 2014-01-04 DIAGNOSIS — J209 Acute bronchitis, unspecified: Secondary | ICD-10-CM | POA: Insufficient documentation

## 2014-01-04 NOTE — Assessment & Plan Note (Signed)
DuoNeb treatment times one in office. Major improvement in lung examination. Rx azithromycin. Rx Medrol Dosepak. Sample of Advair given to patient with instructions on usage. Discussed with patient that we may need to consider pulmonary function testing if her asthmatic symptoms are becoming more persistent.

## 2014-01-18 ENCOUNTER — Ambulatory Visit (INDEPENDENT_AMBULATORY_CARE_PROVIDER_SITE_OTHER): Payer: BC Managed Care – PPO | Admitting: Physician Assistant

## 2014-01-18 ENCOUNTER — Encounter: Payer: Self-pay | Admitting: Physician Assistant

## 2014-01-18 VITALS — BP 136/84 | HR 80 | Temp 98.5°F | Resp 16 | Ht 67.0 in | Wt 190.1 lb

## 2014-01-18 DIAGNOSIS — Z0000 Encounter for general adult medical examination without abnormal findings: Secondary | ICD-10-CM

## 2014-01-18 DIAGNOSIS — Z23 Encounter for immunization: Secondary | ICD-10-CM

## 2014-01-18 DIAGNOSIS — J309 Allergic rhinitis, unspecified: Secondary | ICD-10-CM

## 2014-01-18 DIAGNOSIS — E785 Hyperlipidemia, unspecified: Secondary | ICD-10-CM

## 2014-01-18 DIAGNOSIS — I10 Essential (primary) hypertension: Secondary | ICD-10-CM

## 2014-01-18 DIAGNOSIS — J45909 Unspecified asthma, uncomplicated: Secondary | ICD-10-CM

## 2014-01-18 MED ORDER — LOSARTAN POTASSIUM 50 MG PO TABS: 50.0000 mg | ORAL_TABLET | Freq: Every day | ORAL | Status: DC

## 2014-01-18 MED ORDER — ATORVASTATIN CALCIUM 10 MG PO TABS: 10.0000 mg | ORAL_TABLET | Freq: Every day | ORAL | Status: DC

## 2014-01-18 MED ORDER — AMLODIPINE BESYLATE 5 MG PO TABS: 5.0000 mg | ORAL_TABLET | Freq: Every day | ORAL | Status: DC

## 2014-01-18 NOTE — Progress Notes (Signed)
Patient presents to clinic today to transfer care and for annual examination.   Acute Concerns: No acute concerns.  Need medication refills.  Will return, fasting for labs.  Chronic Issues: Hypertension -- BP normotensive in clinic today. Patient's BP controlled with amlodipine 5 mg and losartan 50 mg. Patient denies headache, vision changes, chest pain, palpitations, shortness of breath, lightheadedness or dizziness.  Asthma -- Symptoms much improved after recent exacerbation. Wanted refill of her rescue inhaler. Is not currently having to increase use of inhaler.  Hyperlipidemia -- well-controlled with atorvastatin 10 mg daily. Patient is due for fasting lipid panel. Denies myalgias or arthralgias.  Allergic Rhinitis -- seasonal. Controlled with occasional Claritin or Zyrtec.  Health Maintenance: Dental -- UTD Vision -- UTD Immunizations -- Flu shot given by RN staff.  Patient is unsure of last Td, but wishes to defer shot until she can verify last immunization. Colonoscopy -- 2011 due in 2016; hx of adematous polyps and diverticulits Mammogram -- December 2014; no abnormalities. PAP -- [facility].  Has appointment scheduled in March for routine examination  Past Medical History  Diagnosis Date  . Hyperlipidemia   . Hypertension   . Hx of adenomatous colonic polyps 2004 & 2007  . Diverticulitis 2009 & 2011  . Allergy     seasonal & animal triggers  . Asthma     Past Surgical History  Procedure Laterality Date  . Appendectomy    . Breast biopsy  2005  . Anterior cruciate ligament repair  1995  . Colonoscopy with polypectomy      Dr Cristina Gong    No current outpatient prescriptions on file prior to visit.   No current facility-administered medications on file prior to visit.    Allergies  Allergen Reactions  . Codeine     Vomitting  . Lisinopril     cough    Family History  Problem Relation Age of Onset  . Colon cancer Father   . Heart  attack Maternal Aunt 46  . Heart attack Paternal Aunt 85    Bullous Pemphigoid; Hamman - Rich Syndrome  . Hyperlipidemia      5 M aunts  . Hyperlipidemia Mother   . Thyroid disease      4 M aunts  . GER disease Son   . Stroke Maternal Grandmother   . Diabetes Neg Hx   . Hypertension      multiple M aunts   . Breast cancer      73 M aunts  . Breast cancer Mother   . Lung cancer Mother     2 lobectomies ; radiation    History   Social History  . Marital Status: Single    Spouse Name: N/A    Number of Children: N/A  . Years of Education: N/A   Occupational History  . Not on file.   Social History Main Topics  . Smoking status: Never Smoker   . Smokeless tobacco: Not on file  . Alcohol Use: 3.0 oz/week    5 Glasses of wine per week     Comment:  socially  . Drug Use: No  . Sexual Activity: Not on file   Other Topics Concern  . Not on file   Social History Narrative  . No narrative on file   Review of Systems  Constitutional: Negative for fever and weight loss.  HENT: Negative for ear discharge, ear pain, hearing loss and tinnitus.   Eyes: Negative for blurred vision, double  vision, photophobia and pain.  Respiratory: Negative for cough, shortness of breath and wheezing.   Cardiovascular: Negative for chest pain and palpitations.  Gastrointestinal: Negative for heartburn, nausea, vomiting, abdominal pain, diarrhea, constipation, blood in stool and melena.  Genitourinary: Negative for dysuria, urgency, frequency, hematuria and flank pain.  Musculoskeletal: Negative for myalgias.  Neurological: Negative for dizziness, seizures, loss of consciousness and headaches.  Endo/Heme/Allergies: Positive for environmental allergies.  Psychiatric/Behavioral: Negative for depression, suicidal ideas, hallucinations and substance abuse. The patient is not nervous/anxious and does not have insomnia.     BP 136/84  Pulse 80  Temp(Src) 98.5 F (36.9 C) (Oral)  Resp 16  Ht 5'  7" (1.702 m)  Wt 190 lb 2 oz (86.24 kg)  BMI 29.77 kg/m2  SpO2 97%  Physical Exam  Vitals reviewed. Constitutional: She is oriented to person, place, and time and well-developed, well-nourished, and in no distress.  HENT:  Head: Normocephalic and atraumatic.  Right Ear: External ear normal.  Left Ear: External ear normal.  Nose: Nose normal.  Mouth/Throat: Oropharynx is clear and moist. No oropharyngeal exudate.  Tympanic membranes within normal limits bilaterally.  Eyes: Conjunctivae are normal. Pupils are equal, round, and reactive to light.  Neck: Neck supple.  Cardiovascular: Normal rate, regular rhythm, normal heart sounds and intact distal pulses.   Pulmonary/Chest: Effort normal and breath sounds normal. No respiratory distress. She has no wheezes. She has no rales. She exhibits no tenderness.  Genitourinary:  Deferred to OB/GYN. Patient sees [facility]. Has appointment in March.  Lymphadenopathy:    She has no cervical adenopathy.  Neurological: She is alert and oriented to person, place, and time. No cranial nerve deficit.  Skin: Skin is warm and dry. No rash noted.  Psychiatric: Affect normal.    No results found for this or any previous visit (from the past 2160 hour(s)).  Assessment/Plan: HYPERTENSION Well controlled. Continue current regimen. Medications refilled.  ALLERGIC RHINITIS Seasonal. Continue when necessary Claritin or Zyrtec for relief of symptoms.  ASTHMA Refill albuterol.  HYPERLIPIDEMIA Medications refilled. Patient to return for fasting lipid panel.  Visit for preventive health examination Medical history reviewed and updated. Flu shot given by nursing staff. Patient up-to-date on health maintenance. Is followed [facility] for routine gynecological care. Will obtain fasting labs.

## 2014-01-18 NOTE — Progress Notes (Signed)
Pre visit review using our clinic review tool, if applicable. No additional management support is needed unless otherwise documented below in the visit note/SLS  

## 2014-01-18 NOTE — Patient Instructions (Addendum)
Please continue medications as prescribed.  Please obtain labs.  I will call you with your results. If all labs look good, I will see you in 6 months.  Return sooner if you need [place].  Please follow-up with your OB/GYN for routine gynecological care.  It was a pleasure participating in your care today.

## 2014-01-19 DIAGNOSIS — Z0000 Encounter for general adult medical examination without abnormal findings: Secondary | ICD-10-CM | POA: Insufficient documentation

## 2014-01-19 MED ORDER — ALBUTEROL SULFATE HFA 108 (90 BASE) MCG/ACT IN AERS: 2.0000 | INHALATION_SPRAY | Freq: Four times a day (QID) | RESPIRATORY_TRACT | Status: DC | PRN

## 2014-01-19 NOTE — Assessment & Plan Note (Signed)
Seasonal. Continue when necessary Claritin or Zyrtec for relief of symptoms.

## 2014-01-19 NOTE — Assessment & Plan Note (Signed)
Medications refilled. Patient to return for fasting lipid panel.

## 2014-01-19 NOTE — Assessment & Plan Note (Signed)
Refill albuterol. 

## 2014-01-19 NOTE — Assessment & Plan Note (Signed)
Well controlled. Continue current regimen. Medications refilled. 

## 2014-01-19 NOTE — Assessment & Plan Note (Signed)
Medical history reviewed and updated. Flu shot given by nursing staff. Patient up-to-date on health maintenance. Is followed by [facility] for routine gynecological care. Will obtain fasting labs.

## 2014-02-01 ENCOUNTER — Other Ambulatory Visit: Payer: BC Managed Care – PPO

## 2014-02-01 LAB — URINALYSIS, ROUTINE W REFLEX MICROSCOPIC
BILIRUBIN URINE: NEGATIVE
HGB URINE DIPSTICK: NEGATIVE
KETONES UR: NEGATIVE
LEUKOCYTES UA: NEGATIVE
Nitrite: NEGATIVE
PH: 5.5 (ref 5.0–8.0)
RBC / HPF: NONE SEEN (ref 0–?)
Specific Gravity, Urine: 1.025 (ref 1.000–1.030)
Total Protein, Urine: NEGATIVE
Urine Glucose: NEGATIVE
Urobilinogen, UA: 0.2 (ref 0.0–1.0)

## 2014-02-01 LAB — LIPID PANEL
Cholesterol: 256 mg/dL — ABNORMAL HIGH (ref 0–200)
HDL: 79 mg/dL (ref >39.00)
Total CHOL/HDL Ratio: 3
Triglycerides: 135 mg/dL (ref 0.0–149.0)
VLDL: 27 mg/dL (ref 0.0–40.0)

## 2014-02-01 LAB — LDL CHOLESTEROL, DIRECT: Direct LDL: 165.8 mg/dL

## 2014-02-01 LAB — HEMOGLOBIN A1C: Hgb A1c MFr Bld: 5.6 % (ref 4.6–6.5)

## 2014-02-20 ENCOUNTER — Encounter: Payer: Self-pay | Admitting: *Deleted

## 2014-05-10 ENCOUNTER — Telehealth: Payer: Self-pay | Admitting: *Deleted

## 2014-05-10 DIAGNOSIS — L309 Dermatitis, unspecified: Secondary | ICD-10-CM

## 2014-05-10 MED ORDER — CLOBETASOL PROPIONATE 0.05 % EX CREA: 1.0000 "application " | TOPICAL_CREAM | Freq: Two times a day (BID) | CUTANEOUS | Status: DC

## 2014-05-10 NOTE — Telephone Encounter (Signed)
Patient states that she is at the beach at [facility] and she "thinks she has poison ivy starting on her hand" and she is requesting that a Rx be sent to [facility] at [facility]

## 2014-05-10 NOTE — Telephone Encounter (Signed)
Spoke with the patient concerning her symptoms -- seem very consistent with rhus dermatitis. Will send in Clobetasol BID x 7-14 days.  Cool compresses. Antihistamine.  Sarna or Calamine lotion.  Avoid re-exposure.

## 2014-07-20 ENCOUNTER — Other Ambulatory Visit: Payer: Self-pay | Admitting: Family Medicine

## 2014-10-11 ENCOUNTER — Other Ambulatory Visit: Payer: Self-pay

## 2014-10-11 DIAGNOSIS — Z1231 Encounter for screening mammogram for malignant neoplasm of breast: Secondary | ICD-10-CM

## 2014-11-11 ENCOUNTER — Telehealth: Payer: Self-pay | Admitting: Physician Assistant

## 2014-11-11 MED ORDER — LOSARTAN POTASSIUM 50 MG PO TABS: ORAL_TABLET | ORAL | Status: DC

## 2014-11-11 MED ORDER — ATORVASTATIN CALCIUM 10 MG PO TABS: ORAL_TABLET | ORAL | Status: DC

## 2014-11-11 MED ORDER — AMLODIPINE BESYLATE 5 MG PO TABS: ORAL_TABLET | ORAL | Status: DC

## 2014-11-11 NOTE — Telephone Encounter (Signed)
Caller name: Lili Relation to pt: self Call back number: [phone] Pharmacy: [email or web address]  Reason for call:   Patient states that she has one week left of her medication and a refill of losartan, amlodipine, and atorvastatin.

## 2014-11-11 NOTE — Telephone Encounter (Signed)
Rx request to pharmacy, 30-day supply only per provider; patient was due for follow-up in August 2015-Must have appointment prior to future refills/SLS Please call patient to inform of provider instructions and schedule f/u OV. Thanks.

## 2014-11-12 NOTE — Telephone Encounter (Signed)
Left detailed message informing patient of med refill and to call our office to schedule appointment. °

## 2014-11-18 ENCOUNTER — Other Ambulatory Visit: Payer: Self-pay | Admitting: Physician Assistant

## 2014-11-18 MED ORDER — ATORVASTATIN CALCIUM 10 MG PO TABS: ORAL_TABLET | ORAL | Status: DC

## 2014-11-18 MED ORDER — AMLODIPINE BESYLATE 5 MG PO TABS: ORAL_TABLET | ORAL | Status: DC

## 2014-11-18 MED ORDER — LOSARTAN POTASSIUM 50 MG PO TABS: ORAL_TABLET | ORAL | Status: DC

## 2014-11-18 NOTE — Telephone Encounter (Signed)
Pt has an appointment for her CPE on feb 9th, pt states she now has [employer] and they only pay once a year for her physical, pt is unable to come in for a follow up because she does not work and its to expensive. Would like to know if [facility] P[facility]asant will provide her meds until she come in for her CPE.. States to call her with any questions or concerns

## 2014-11-18 NOTE — Telephone Encounter (Signed)
Informed patient of this.  °

## 2014-11-18 NOTE — Telephone Encounter (Signed)
Insurance will still cover for a follow-up.  Will send in 1 more month refill of medication but no further refills without visit.  Please call patient to inform her of this.

## 2014-11-21 ENCOUNTER — Ambulatory Visit
Admission: RE | Admit: 2014-11-21 | Discharge: 2014-11-21 | Disposition: A | Discharge status: Home / Self Care | Payer: BC Managed Care – PPO | Source: Ambulatory Visit

## 2014-11-21 DIAGNOSIS — Z1231 Encounter for screening mammogram for malignant neoplasm of breast: Secondary | ICD-10-CM

## 2015-01-21 ENCOUNTER — Encounter: Payer: Self-pay | Admitting: Physician Assistant

## 2015-01-21 ENCOUNTER — Ambulatory Visit (INDEPENDENT_AMBULATORY_CARE_PROVIDER_SITE_OTHER): Payer: BLUE CROSS/BLUE SHIELD | Admitting: Physician Assistant

## 2015-01-21 VITALS — BP 127/90 | HR 78 | Temp 97.9°F | Resp 16 | Ht 67.0 in | Wt 198.0 lb

## 2015-01-21 DIAGNOSIS — E785 Hyperlipidemia, unspecified: Secondary | ICD-10-CM

## 2015-01-21 DIAGNOSIS — I10 Essential (primary) hypertension: Secondary | ICD-10-CM

## 2015-01-21 DIAGNOSIS — Z23 Encounter for immunization: Secondary | ICD-10-CM

## 2015-01-21 DIAGNOSIS — Z0000 Encounter for general adult medical examination without abnormal findings: Secondary | ICD-10-CM

## 2015-01-21 LAB — BASIC METABOLIC PANEL
BUN: 13 mg/dL (ref 6–23)
CHLORIDE: 101 meq/L (ref 96–112)
CO2: 29 meq/L (ref 19–32)
Calcium: 9.9 mg/dL (ref 8.4–10.5)
Creatinine, Ser: 0.83 mg/dL (ref 0.40–1.20)
GFR: 75.08 mL/min (ref >60.00)
GLUCOSE: 110 mg/dL — AB (ref 70–99)
Potassium: 3.9 mEq/L (ref 3.5–5.1)
Sodium: 138 mEq/L (ref 135–145)

## 2015-01-21 LAB — URINALYSIS, ROUTINE W REFLEX MICROSCOPIC
BILIRUBIN URINE: NEGATIVE
Hgb urine dipstick: NEGATIVE
Ketones, ur: NEGATIVE
LEUKOCYTES UA: NEGATIVE
NITRITE: NEGATIVE
PH: 6 (ref 5.0–8.0)
RBC / HPF: NONE SEEN (ref 0–?)
Specific Gravity, Urine: 1.02 (ref 1.000–1.030)
TOTAL PROTEIN, URINE-UPE24: NEGATIVE
Urine Glucose: NEGATIVE
Urobilinogen, UA: 0.2 (ref 0.0–1.0)
WBC, UA: NONE SEEN (ref 0–?)

## 2015-01-21 LAB — LIPID PANEL
Cholesterol: 260 mg/dL — ABNORMAL HIGH (ref 0–200)
HDL: 79.7 mg/dL (ref >39.00)
LDL CALC: 154 mg/dL — AB (ref 0–99)
NONHDL: 180.3
Total CHOL/HDL Ratio: 3
Triglycerides: 131 mg/dL (ref 0.0–149.0)
VLDL: 26.2 mg/dL (ref 0.0–40.0)

## 2015-01-21 LAB — CBC
HEMATOCRIT: 42.7 % (ref 36.0–46.0)
HEMOGLOBIN: 14.7 g/dL (ref 12.0–15.0)
MCHC: 34.5 g/dL (ref 30.0–36.0)
MCV: 93.7 fl (ref 78.0–100.0)
Platelets: 342 10*3/uL (ref 150.0–400.0)
RBC: 4.55 Mil/uL (ref 3.87–5.11)
RDW: 12.9 % (ref 11.5–15.5)
WBC: 5.9 10*3/uL (ref 4.0–10.5)

## 2015-01-21 LAB — HEPATIC FUNCTION PANEL
ALT: 35 U/L (ref 0–35)
AST: 23 U/L (ref 0–37)
Albumin: 4.5 g/dL (ref 3.5–5.2)
Alkaline Phosphatase: 59 U/L (ref 39–117)
Bilirubin, Direct: 0.2 mg/dL (ref 0.0–0.3)
TOTAL PROTEIN: 7.6 g/dL (ref 6.0–8.3)
Total Bilirubin: 0.9 mg/dL (ref 0.2–1.2)

## 2015-01-21 LAB — TSH: TSH: 5.27 u[IU]/mL — AB (ref 0.35–4.50)

## 2015-01-21 LAB — HEMOGLOBIN A1C: Hgb A1c MFr Bld: 5.7 % (ref 4.6–6.5)

## 2015-01-21 MED ORDER — ATORVASTATIN CALCIUM 10 MG PO TABS: ORAL_TABLET | ORAL | Status: DC

## 2015-01-21 MED ORDER — LOSARTAN POTASSIUM 50 MG PO TABS: ORAL_TABLET | ORAL | Status: DC

## 2015-01-21 MED ORDER — AMLODIPINE BESYLATE 5 MG PO TABS: ORAL_TABLET | ORAL | Status: DC

## 2015-01-21 NOTE — Progress Notes (Signed)
Pre visit review using our clinic review tool, if applicable. No additional management support is needed unless otherwise documented below in the visit note/SLS  

## 2015-01-21 NOTE — Assessment & Plan Note (Signed)
Well-controlled. Continue current regimen.  Medications refilled. Will check labs today.

## 2015-01-21 NOTE — Patient Instructions (Signed)
Please stop by the lab for blood work.  I will call you with your results. Please continue your current medication regimen as directed. Follow-up with your OB/GYN as scheduled.  Follow-up will be based on your lab results.  Preventive Care for Adults A healthy lifestyle and preventive care can promote health and wellness. Preventive health guidelines for women include the following key practices.  A routine yearly physical is a good way to check with your health care provider about your health and preventive screening. It is a chance to share any concerns and updates on your health and to receive a thorough exam.  Visit your dentist for a routine exam and preventive care every 6 months. Brush your teeth twice a day and floss once a day. Good oral hygiene prevents tooth decay and gum disease.  The frequency of eye exams is based on your age, health, family medical history, use of contact lenses, and other factors. Follow your health care provider's recommendations for frequency of eye exams.  Eat a healthy diet. Foods like vegetables, fruits, whole grains, low-fat dairy products, and lean protein foods contain the nutrients you need without too many calories. Decrease your intake of foods high in solid fats, added sugars, and salt. Eat the right amount of calories for you.Get information about a proper diet from your health care provider, if necessary.  Regular physical exercise is one of the most important things you can do for your health. Most adults should get at least 150 minutes of moderate-intensity exercise (any activity that increases your heart rate and causes you to sweat) each week. In addition, most adults need muscle-strengthening exercises on 2 or more days a week.  Maintain a healthy weight. The body mass index (BMI) is a screening tool to identify possible weight problems. It provides an estimate of body fat based on height and weight. Your health care provider can find your BMI  and can help you achieve or maintain a healthy weight.For adults 20 years and older:  A BMI below 18.5 is considered underweight.  A BMI of 18.5 to 24.9 is normal.  A BMI of 25 to 29.9 is considered overweight.  A BMI of 30 and above is considered obese.  Maintain normal blood lipids and cholesterol levels by exercising and minimizing your intake of saturated fat. Eat a balanced diet with plenty of fruit and vegetables. Blood tests for lipids and cholesterol should begin at age 91 and be repeated every 5 years. If your lipid or cholesterol levels are high, you are over 50, or you are at high risk for heart disease, you may need your cholesterol levels checked more frequently.Ongoing high lipid and cholesterol levels should be treated with medicines if diet and exercise are not working.  If you smoke, find out from your health care provider how to quit. If you do not use tobacco, do not start.  Lung cancer screening is recommended for adults aged 73-80 years who are at high risk for developing lung cancer because of a history of smoking. A yearly low-dose CT scan of the lungs is recommended for people who have at least a 30-pack-year history of smoking and are a current smoker or have quit within the past 15 years. A pack year of smoking is smoking an average of 1 pack of cigarettes a day for 1 year (for example: 1 pack a day for 30 years or 2 packs a day for 15 years). Yearly screening should continue until the smoker has stopped  smoking for at least 15 years. Yearly screening should be stopped for people who develop a health problem that would prevent them from having lung cancer treatment.  If you are pregnant, do not drink alcohol. If you are breastfeeding, be very cautious about drinking alcohol. If you are not pregnant and choose to drink alcohol, do not have more than 1 drink per day. One drink is considered to be 12 ounces (355 mL) of beer, 5 ounces (148 mL) of wine, or 1.5 ounces (44 mL) of  liquor.  Avoid use of street drugs. Do not share needles with anyone. Ask for help if you need support or instructions about stopping the use of drugs.  High blood pressure causes heart disease and increases the risk of stroke. Your blood pressure should be checked at least every 1 to 2 years. Ongoing high blood pressure should be treated with medicines if weight loss and exercise do not work.  If you are 50-52 years old, ask your health care provider if you should take aspirin to prevent strokes.  Diabetes screening involves taking a blood sample to check your fasting blood sugar level. This should be done once every 3 years, after age 1, if you are within normal weight and without risk factors for diabetes. Testing should be considered at a younger age or be carried out more frequently if you are overweight and have at least 1 risk factor for diabetes.  Breast cancer screening is essential preventive care for women. You should practice "breast self-awareness." This means understanding the normal appearance and feel of your breasts and may include breast self-examination. Any changes detected, no matter how small, should be reported to a health care provider. Women in their 49s and 30s should have a clinical breast exam (CBE) by a health care provider as part of a regular health exam every 1 to 3 years. After age 92, women should have a CBE every year. Starting at age 34, women should consider having a mammogram (breast X-ray test) every year. Women who have a family history of breast cancer should talk to their health care provider about genetic screening. Women at a high risk of breast cancer should talk to their health care providers about having an MRI and a mammogram every year.  Breast cancer gene (BRCA)-related cancer risk assessment is recommended for women who have family members with BRCA-related cancers. BRCA-related cancers include breast, ovarian, tubal, and peritoneal cancers. Having  family members with these cancers may be associated with an increased risk for harmful changes (mutations) in the breast cancer genes BRCA1 and BRCA2. Results of the assessment will determine the need for genetic counseling and BRCA1 and BRCA2 testing.  Routine pelvic exams to screen for cancer are no longer recommended for nonpregnant women who are considered low risk for cancer of the pelvic organs (ovaries, uterus, and vagina) and who do not have symptoms. Ask your health care provider if a screening pelvic exam is right for you.  If you have had past treatment for cervical cancer or a condition that could lead to cancer, you need Pap tests and screening for cancer for at least 20 years after your treatment. If Pap tests have been discontinued, your risk factors (such as having a new sexual partner) need to be reassessed to determine if screening should be resumed. Some women have medical problems that increase the chance of getting cervical cancer. In these cases, your health care provider may recommend more frequent screening and Pap tests.  The HPV test is an additional test that may be used for cervical cancer screening. The HPV test looks for the virus that can cause the cell changes on the cervix. The cells collected during the Pap test can be tested for HPV. The HPV test could be used to screen women aged 71 years and older, and should be used in women of any age who have unclear Pap test results. After the age of 62, women should have HPV testing at the same frequency as a Pap test.  Colorectal cancer can be detected and often prevented. Most routine colorectal cancer screening begins at the age of 4 years and continues through age 6 years. However, your health care provider may recommend screening at an earlier age if you have risk factors for colon cancer. On a yearly basis, your health care provider may provide home test kits to check for hidden blood in the stool. Use of a small camera at  the end of a tube, to directly examine the colon (sigmoidoscopy or colonoscopy), can detect the earliest forms of colorectal cancer. Talk to your health care provider about this at age 32, when routine screening begins. Direct exam of the colon should be repeated every 5-10 years through age 21 years, unless early forms of pre-cancerous polyps or small growths are found.  People who are at an increased risk for hepatitis B should be screened for this virus. You are considered at high risk for hepatitis B if:  You were born in a country where hepatitis B occurs often. Talk with your health care provider about which countries are considered high risk.  Your parents were born in a high-risk country and you have not received a shot to protect against hepatitis B (hepatitis B vaccine).  You have HIV or AIDS.  You use needles to inject street drugs.  You live with, or have sex with, someone who has hepatitis B.  You get hemodialysis treatment.  You take certain medicines for conditions like cancer, organ transplantation, and autoimmune conditions.  Hepatitis C blood testing is recommended for all people born from [birth year] through 1965 and any individual with known risks for hepatitis C.  Practice safe sex. Use condoms and avoid high-risk sexual practices to reduce the spread of sexually transmitted infections (STIs). STIs include gonorrhea, chlamydia, syphilis, trichomonas, herpes, HPV, and human immunodeficiency virus (HIV). Herpes, HIV, and HPV are viral illnesses that have no cure. They can result in disability, cancer, and death.  You should be screened for sexually transmitted illnesses (STIs) including gonorrhea and chlamydia if:  You are sexually active and are younger than 24 years.  You are older than 24 years and your health care provider tells you that you are at risk for this type of infection.  Your sexual activity has changed since you were last screened and you are at an increased  risk for chlamydia or gonorrhea. Ask your health care provider if you are at risk.  If you are at risk of being infected with HIV, it is recommended that you take a prescription medicine daily to prevent HIV infection. This is called preexposure prophylaxis (PrEP). You are considered at risk if:  You are a heterosexual woman, are sexually active, and are at increased risk for HIV infection.  You take drugs by injection.  You are sexually active with a partner who has HIV.  Talk with your health care provider about whether you are at high risk of being infected with HIV. If you  choose to begin PrEP, you should first be tested for HIV. You should then be tested every 3 months for as long as you are taking PrEP.  Osteoporosis is a disease in which the bones lose minerals and strength with aging. This can result in serious bone fractures or breaks. The risk of osteoporosis can be identified using a bone density scan. Women ages 48 years and over and women at risk for fractures or osteoporosis should discuss screening with their health care providers. Ask your health care provider whether you should take a calcium supplement or vitamin D to reduce the rate of osteoporosis.  Menopause can be associated with physical symptoms and risks. Hormone replacement therapy is available to decrease symptoms and risks. You should talk to your health care provider about whether hormone replacement therapy is right for you.  Use sunscreen. Apply sunscreen liberally and repeatedly throughout the day. You should seek shade when your shadow is shorter than you. Protect yourself by wearing long sleeves, pants, a wide-brimmed hat, and sunglasses year round, whenever you are outdoors.  Once a month, do a whole body skin exam, using a mirror to look at the skin on your back. Tell your health care provider of new moles, moles that have irregular borders, moles that are larger than a pencil eraser, or moles that have changed  in shape or color.  Stay current with required vaccines (immunizations).  Influenza vaccine. All adults should be immunized every year.  Tetanus, diphtheria, and acellular pertussis (Td, Tdap) vaccine. Pregnant women should receive 1 dose of Tdap vaccine during each pregnancy. The dose should be obtained regardless of the length of time since the last dose. Immunization is preferred during the 27th-36th week of gestation. An adult who has not previously received Tdap or who does not know her vaccine status should receive 1 dose of Tdap. This initial dose should be followed by tetanus and diphtheria toxoids (Td) booster doses every 10 years. Adults with an unknown or incomplete history of completing a 3-dose immunization series with Td-containing vaccines should begin or complete a primary immunization series including a Tdap dose. Adults should receive a Td booster every 10 years.  Varicella vaccine. An adult without evidence of immunity to varicella should receive 2 doses or a second dose if she has previously received 1 dose. Pregnant females who do not have evidence of immunity should receive the first dose after pregnancy. This first dose should be obtained before leaving the health care facility. The second dose should be obtained 4-8 weeks after the first dose.  Human papillomavirus (HPV) vaccine. Females aged 13-26 years who have not received the vaccine previously should obtain the 3-dose series. The vaccine is not recommended for use in pregnant females. However, pregnancy testing is not needed before receiving a dose. If a female is found to be pregnant after receiving a dose, no treatment is needed. In that [place], the remaining doses should be delayed until after the pregnancy. Immunization is recommended for any person with an immunocompromised condition through the age of 25 years if she did not get any or all doses earlier. During the 3-dose series, the second dose should be obtained 4-8 weeks  after the first dose. The third dose should be obtained 24 weeks after the first dose and 16 weeks after the second dose.  Zoster vaccine. One dose is recommended for adults aged 76 years or older unless certain conditions are present.  Measles, mumps, and rubella (MMR) vaccine. Adults born before  1956 generally are considered immune to measles and mumps. Adults born in [birth year] or later should have 1 or more doses of MMR vaccine unless there is a contraindication to the vaccine or there is laboratory evidence of immunity to each of the three diseases. A routine second dose of MMR vaccine should be obtained at least 28 days after the first dose for students attending postsecondary schools, health care workers, or international travelers. People who received inactivated measles vaccine or an unknown type of measles vaccine during 1963-1967 should receive 2 doses of MMR vaccine. People who received inactivated mumps vaccine or an unknown type of mumps vaccine before 1979 and are at high risk for mumps infection should consider immunization with 2 doses of MMR vaccine. For females of childbearing age, rubella immunity should be determined. If there is no evidence of immunity, females who are not pregnant should be vaccinated. If there is no evidence of immunity, females who are pregnant should delay immunization until after pregnancy. Unvaccinated health care workers born before [birth year] who lack laboratory evidence of measles, mumps, or rubella immunity or laboratory confirmation of disease should consider measles and mumps immunization with 2 doses of MMR vaccine or rubella immunization with 1 dose of MMR vaccine.  Pneumococcal 13-valent conjugate (PCV13) vaccine. When indicated, a person who is uncertain of her immunization history and has no record of immunization should receive the PCV13 vaccine. An adult aged 33 years or older who has certain medical conditions and has not been previously immunized should receive 1  dose of PCV13 vaccine. This PCV13 should be followed with a dose of pneumococcal polysaccharide (PPSV23) vaccine. The PPSV23 vaccine dose should be obtained at least 8 weeks after the dose of PCV13 vaccine. An adult aged 26 years or older who has certain medical conditions and previously received 1 or more doses of PPSV23 vaccine should receive 1 dose of PCV13. The PCV13 vaccine dose should be obtained 1 or more years after the last PPSV23 vaccine dose.  Pneumococcal polysaccharide (PPSV23) vaccine. When PCV13 is also indicated, PCV13 should be obtained first. All adults aged 91 years and older should be immunized. An adult younger than age 51 years who has certain medical conditions should be immunized. Any person who resides in a nursing home or long-term care facility should be immunized. An adult smoker should be immunized. People with an immunocompromised condition and certain other conditions should receive both PCV13 and PPSV23 vaccines. People with human immunodeficiency virus (HIV) infection should be immunized as soon as possible after diagnosis. Immunization during chemotherapy or radiation therapy should be avoided. Routine use of PPSV23 vaccine is not recommended for American Indians, [place] Natives, or people younger than 65 years unless there are medical conditions that require PPSV23 vaccine. When indicated, people who have unknown immunization and have no record of immunization should receive PPSV23 vaccine. One-time revaccination 5 years after the first dose of PPSV23 is recommended for people aged 19-64 years who have chronic kidney failure, nephrotic syndrome, asplenia, or immunocompromised conditions. People who received 1-2 doses of PPSV23 before age 38 years should receive another dose of PPSV23 vaccine at age 47 years or later if at least 5 years have passed since the previous dose. Doses of PPSV23 are not needed for people immunized with PPSV23 at or after age 75 years.  Meningococcal  vaccine. Adults with asplenia or persistent complement component deficiencies should receive 2 doses of quadrivalent meningococcal conjugate (MenACWY-D) vaccine. The doses should be obtained at least 2 months  apart. Microbiologists working with certain meningococcal bacteria, [employer] recruits, people at risk during an outbreak, and people who travel to or live in countries with a high rate of meningitis should be immunized. A first-year college student up through age 13 years who is living in a residence hall should receive a dose if she did not receive a dose on or after her 16th birthday. Adults who have certain high-risk conditions should receive one or more doses of vaccine.  Hepatitis A vaccine. Adults who wish to be protected from this disease, have certain high-risk conditions, work with hepatitis A-infected animals, work in hepatitis A research labs, or travel to or work in countries with a high rate of hepatitis A should be immunized. Adults who were previously unvaccinated and who anticipate close contact with an international adoptee during the first 60 days after arrival in the [place] from a country with a high rate of hepatitis A should be immunized.  Hepatitis B vaccine. Adults who wish to be protected from this disease, have certain high-risk conditions, may be exposed to blood or other infectious body fluids, are household contacts or sex partners of hepatitis B positive people, are clients or workers in certain care facilities, or travel to or work in countries with a high rate of hepatitis B should be immunized.  Haemophilus influenzae type b (Hib) vaccine. A previously unvaccinated person with asplenia or sickle cell disease or having a scheduled splenectomy should receive 1 dose of Hib vaccine. Regardless of previous immunization, a recipient of a hematopoietic stem cell transplant should receive a 3-dose series 6-12 months after her successful transplant. Hib vaccine is not  recommended for adults with HIV infection. Preventive Services / Frequency Ages 45 to 42 years  Blood pressure check.** / Every 1 to 2 years.  Lipid and cholesterol check.** / Every 5 years beginning at age 54.  Clinical breast exam.** / Every 3 years for women in their 22s and 106s.  BRCA-related cancer risk assessment.** / For women who have family members with a BRCA-related cancer (breast, ovarian, tubal, or peritoneal cancers).  Pap test.** / Every 2 years from ages 55 through 21. Every 3 years starting at age 39 through age 33 or 19 with a history of 3 consecutive normal Pap tests.  HPV screening.** / Every 3 years from ages 75 through ages 63 to 72 with a history of 3 consecutive normal Pap tests.  Hepatitis C blood test.** / For any individual with known risks for hepatitis C.  Skin self-exam. / Monthly.  Influenza vaccine. / Every year.  Tetanus, diphtheria, and acellular pertussis (Tdap, Td) vaccine.** / Consult your health care provider. Pregnant women should receive 1 dose of Tdap vaccine during each pregnancy. 1 dose of Td every 10 years.  Varicella vaccine.** / Consult your health care provider. Pregnant females who do not have evidence of immunity should receive the first dose after pregnancy.  HPV vaccine. / 3 doses over 6 months, if 34 and younger. The vaccine is not recommended for use in pregnant females. However, pregnancy testing is not needed before receiving a dose.  Measles, mumps, rubella (MMR) vaccine.** / You need at least 1 dose of MMR if you were born in 1957 or later. You may also need a 2nd dose. For females of childbearing age, rubella immunity should be determined. If there is no evidence of immunity, females who are not pregnant should be vaccinated. If there is no evidence of immunity, females who are pregnant should  delay immunization until after pregnancy.  Pneumococcal 13-valent conjugate (PCV13) vaccine.** / Consult your health care  provider.  Pneumococcal polysaccharide (PPSV23) vaccine.** / 1 to 2 doses if you smoke cigarettes or if you have certain conditions.  Meningococcal vaccine.** / 1 dose if you are age 82 to 73 years and a Market researcher living in a residence hall, or have one of several medical conditions, you need to get vaccinated against meningococcal disease. You may also need additional booster doses.  Hepatitis A vaccine.** / Consult your health care provider.  Hepatitis B vaccine.** / Consult your health care provider.  Haemophilus influenzae type b (Hib) vaccine.** / Consult your health care provider. Ages 87 to 29 years  Blood pressure check.** / Every 1 to 2 years.  Lipid and cholesterol check.** / Every 5 years beginning at age 67 years.  Lung cancer screening. / Every year if you are aged 26-80 years and have a 30-pack-year history of smoking and currently smoke or have quit within the past 15 years. Yearly screening is stopped once you have quit smoking for at least 15 years or develop a health problem that would prevent you from having lung cancer treatment.  Clinical breast exam.** / Every year after age 27 years.  BRCA-related cancer risk assessment.** / For women who have family members with a BRCA-related cancer (breast, ovarian, tubal, or peritoneal cancers).  Mammogram.** / Every year beginning at age 67 years and continuing for as long as you are in good health. Consult with your health care provider.  Pap test.** / Every 3 years starting at age 97 years through age 43 or 66 years with a history of 3 consecutive normal Pap tests.  HPV screening.** / Every 3 years from ages 3 years through ages 27 to 63 years with a history of 3 consecutive normal Pap tests.  Fecal occult blood test (FOBT) of stool. / Every year beginning at age 71 years and continuing until age 25 years. You may not need to do this test if you get a colonoscopy every 10 years.  Flexible sigmoidoscopy  or colonoscopy.** / Every 5 years for a flexible sigmoidoscopy or every 10 years for a colonoscopy beginning at age 19 years and continuing until age 63 years.  Hepatitis C blood test.** / For all people born from [birth year] through 1964 and any individual with known risks for hepatitis C.  Skin self-exam. / Monthly.  Influenza vaccine. / Every year.  Tetanus, diphtheria, and acellular pertussis (Tdap/Td) vaccine.** / Consult your health care provider. Pregnant women should receive 1 dose of Tdap vaccine during each pregnancy. 1 dose of Td every 10 years.  Varicella vaccine.** / Consult your health care provider. Pregnant females who do not have evidence of immunity should receive the first dose after pregnancy.  Zoster vaccine.** / 1 dose for adults aged 55 years or older.  Measles, mumps, rubella (MMR) vaccine.** / You need at least 1 dose of MMR if you were born in 1957 or later. You may also need a 2nd dose. For females of childbearing age, rubella immunity should be determined. If there is no evidence of immunity, females who are not pregnant should be vaccinated. If there is no evidence of immunity, females who are pregnant should delay immunization until after pregnancy.  Pneumococcal 13-valent conjugate (PCV13) vaccine.** / Consult your health care provider.  Pneumococcal polysaccharide (PPSV23) vaccine.** / 1 to 2 doses if you smoke cigarettes or if you have certain conditions.  Meningococcal vaccine.** /  Consult your health care provider.  Hepatitis A vaccine.** / Consult your health care provider.  Hepatitis B vaccine.** / Consult your health care provider.  Haemophilus influenzae type b (Hib) vaccine.** / Consult your health care provider. Ages 61 years and over  Blood pressure check.** / Every 1 to 2 years.  Lipid and cholesterol check.** / Every 5 years beginning at age 9 years.  Lung cancer screening. / Every year if you are aged 37-80 years and have a 30-pack-year history  of smoking and currently smoke or have quit within the past 15 years. Yearly screening is stopped once you have quit smoking for at least 15 years or develop a health problem that would prevent you from having lung cancer treatment.  Clinical breast exam.** / Every year after age 38 years.  BRCA-related cancer risk assessment.** / For women who have family members with a BRCA-related cancer (breast, ovarian, tubal, or peritoneal cancers).  Mammogram.** / Every year beginning at age 106 years and continuing for as long as you are in good health. Consult with your health care provider.  Pap test.** / Every 3 years starting at age 59 years through age 90 or 10 years with 3 consecutive normal Pap tests. Testing can be stopped between 65 and 70 years with 3 consecutive normal Pap tests and no abnormal Pap or HPV tests in the past 10 years.  HPV screening.** / Every 3 years from ages 45 years through ages 67 or 5 years with a history of 3 consecutive normal Pap tests. Testing can be stopped between 65 and 70 years with 3 consecutive normal Pap tests and no abnormal Pap or HPV tests in the past 10 years.  Fecal occult blood test (FOBT) of stool. / Every year beginning at age 20 years and continuing until age 71 years. You may not need to do this test if you get a colonoscopy every 10 years.  Flexible sigmoidoscopy or colonoscopy.** / Every 5 years for a flexible sigmoidoscopy or every 10 years for a colonoscopy beginning at age 26 years and continuing until age 50 years.  Hepatitis C blood test.** / For all people born from [birth year] through 1964 and any individual with known risks for hepatitis C.  Osteoporosis screening.** / A one-time screening for women ages 67 years and over and women at risk for fractures or osteoporosis.  Skin self-exam. / Monthly.  Influenza vaccine. / Every year.  Tetanus, diphtheria, and acellular pertussis (Tdap/Td) vaccine.** / 1 dose of Td every 10 years.  Varicella  vaccine.** / Consult your health care provider.  Zoster vaccine.** / 1 dose for adults aged 79 years or older.  Pneumococcal 13-valent conjugate (PCV13) vaccine.** / Consult your health care provider.  Pneumococcal polysaccharide (PPSV23) vaccine.** / 1 dose for all adults aged 53 years and older.  Meningococcal vaccine.** / Consult your health care provider.  Hepatitis A vaccine.** / Consult your health care provider.  Hepatitis B vaccine.** / Consult your health care provider.  Haemophilus influenzae type b (Hib) vaccine.** / Consult your health care provider. ** Family history and personal history of risk and conditions may change your health care provider's recommendations. Document Released: 01/25/2002 Document Revised: 04/15/2014 Document Reviewed: 04/26/2011 [facility] Patient Information 2015 [place], [place]. This information is not intended to replace advice given to you by your health care provider. Make sure you discuss any questions you have with your health care provider.

## 2015-01-21 NOTE — Progress Notes (Signed)
Patient presents to clinic today for annual exam.  Patient is fasting for labs.  Acute Concerns: No acute concerns today.  Chronic Issues: Hypertension -- Currently on amlodipine and losartan daily.  BP well-controlled. Patient denies chest pain, palpitations, lightheadedness, dizziness, vision changes or frequent headaches.  Seasonal Asthma -- has albuterol rescue inhaler. Denies episodes requiring medication.    Hyperlipidemia -- currently on Lipitor 10 mg daily.  Is watching diet and plays tennis regularly.    Health Maintenance: Dental -- up-to-date Vision -- up-to-date Immunizations -- Flu shot up-to-date.  Will be getting TDaP today. Colonoscopy --2013.  Due in 2023. Mammogram -- Last in 11/2014. PAP -- up-to-date  Past Medical History  Diagnosis Date  . Hyperlipidemia   . Hypertension   . Hx of adenomatous colonic polyps 2004 & 2007  . Diverticulitis 2009 & 2011  . Allergy     seasonal & animal triggers  . Asthma     Past Surgical History  Procedure Laterality Date  . Appendectomy    . Breast biopsy  2005  . Anterior cruciate ligament repair  1995  . Colonoscopy with polypectomy      Dr Cristina Gong    Current Outpatient Prescriptions on File Prior to Visit  Medication Sig Dispense Refill  . albuterol (PROVENTIL HFA;VENTOLIN HFA) 108 (90 BASE) MCG/ACT inhaler Inhale 2 puffs into the lungs every 6 (six) hours as needed for wheezing or shortness of breath. 1 Inhaler 2  . clobetasol cream (TEMOVATE) 7.25 % Apply 1 application topically 2 (two) times daily. 60 g 2   No current facility-administered medications on file prior to visit.    Allergies  Allergen Reactions  . Codeine     Vomitting  . Lisinopril     cough    Family History  Problem Relation Age of Onset  . Colon cancer Father   . Heart attack Maternal Aunt 46  . Heart attack Paternal Aunt 90    Bullous Pemphigoid; Hamman - Rich Syndrome  . Hyperlipidemia      5 M aunts  . Hyperlipidemia  Mother   . Thyroid disease      4 M aunts  . GER disease Son   . Stroke Maternal Grandmother   . Diabetes Neg Hx   . Hypertension      multiple M aunts   . Breast cancer      12 M aunts  . Breast cancer Mother   . Lung cancer Mother     2 lobectomies ; radiation    History   Social History  . Marital Status: Single    Spouse Name: N/A    Number of Children: N/A  . Years of Education: N/A   Occupational History  . Not on file.   Social History Main Topics  . Smoking status: Never Smoker   . Smokeless tobacco: Not on file  . Alcohol Use: 3.0 oz/week    5 Glasses of wine per week     Comment:  socially  . Drug Use: No  . Sexual Activity: Not Currently   Other Topics Concern  . Not on file   Social History Narrative   Review of Systems  Constitutional: Negative for fever and weight loss.  HENT: Negative for ear discharge, ear pain, hearing loss and tinnitus.   Eyes: Negative for blurred vision, double vision, photophobia and pain.  Respiratory: Negative for cough and shortness of breath.   Cardiovascular: Negative for chest pain and palpitations.  Gastrointestinal: Negative  for heartburn, nausea, vomiting, abdominal pain, diarrhea, constipation, blood in stool and melena.  Genitourinary: Negative for dysuria, urgency, frequency, hematuria and flank pain.  Musculoskeletal: Negative for falls.  Neurological: Negative for dizziness, loss of consciousness and headaches.  Endo/Heme/Allergies: Negative for environmental allergies.  Psychiatric/Behavioral: Negative for depression, suicidal ideas, hallucinations and substance abuse. The patient is not nervous/anxious and does not have insomnia.    BP 127/90 mmHg  Pulse 78  Temp(Src) 97.9 F (36.6 C) (Oral)  Resp 16  Ht 5\' 7"  (1.702 m)  Wt 198 lb (89.812 kg)  BMI 31.00 kg/m2  SpO2 97%  Physical Exam  Constitutional: She is oriented to person, place, and time and well-developed, well-nourished, and in no distress.    HENT:  Head: Normocephalic and atraumatic.  Right Ear: Tympanic membrane, external ear and ear canal normal.  Left Ear: Tympanic membrane, external ear and ear canal normal.  Nose: Nose normal. No mucosal edema.  Mouth/Throat: Uvula is midline, oropharynx is clear and moist and mucous membranes are normal. No oropharyngeal exudate or posterior oropharyngeal erythema.  Eyes: Conjunctivae are normal. Pupils are equal, round, and reactive to light.  Neck: Neck supple. No thyromegaly present.  Cardiovascular: Normal rate, regular rhythm, normal heart sounds and intact distal pulses.   Pulmonary/Chest: Effort normal and breath sounds normal. No respiratory distress. She has no wheezes. She has no rales.  Abdominal: Soft. Bowel sounds are normal. She exhibits no distension and no mass. There is no tenderness. There is no rebound and no guarding.  Lymphadenopathy:    She has no cervical adenopathy.  Neurological: She is alert and oriented to person, place, and time. No cranial nerve deficit.  Skin: Skin is warm and dry. No rash noted.  Psychiatric: Affect normal.  Vitals reviewed.  Assessment/Plan: Essential hypertension Well-controlled. Continue current regimen.  Medications refilled. Will check labs today.   Hyperlipidemia Tolerating statin without myalgias.  Will continue current regimen while obtaining repeat fasting lipids and LFTs.   Visit for preventive health examination I have reviewed the patient's medical history in detail and updated the computerized patient record. PHQ-9 Depression screen performed with score of 0.  Patient up-to-date on colonoscopy, mammogram and PAP. Flu shot up-to-date.  TDaP given today by nursing.  Will obtain fasting labs today.  Preventive care discussed with patient.  Handout given.

## 2015-01-21 NOTE — Assessment & Plan Note (Signed)
Tolerating statin without myalgias.  Will continue current regimen while obtaining repeat fasting lipids and LFTs.

## 2015-01-21 NOTE — Assessment & Plan Note (Signed)
I have reviewed the patient's medical history in detail and updated the computerized patient record. PHQ-9 Depression screen performed with score of 0.  Patient up-to-date on colonoscopy, mammogram and PAP. Flu shot up-to-date.  TDaP given today by nursing.  Will obtain fasting labs today.  Preventive care discussed with patient.  Handout given.

## 2015-01-21 NOTE — Addendum Note (Signed)
Addended by: Rockwell Germany on: 01/21/2015 12:17 PM   Modules accepted: Orders

## 2015-01-22 ENCOUNTER — Telehealth: Payer: Self-pay | Admitting: Physician Assistant

## 2015-01-22 DIAGNOSIS — R7989 Other specified abnormal findings of blood chemistry: Secondary | ICD-10-CM

## 2015-01-22 NOTE — Telephone Encounter (Signed)
Caller name: Machel, Violante Relation to pt: SELF  Call back number: BEST # [phone]   Reason for call: pt returning your call please call home #

## 2015-01-22 NOTE — Telephone Encounter (Signed)
Lab results are in -- Her cholesterol is still elevated with Total at 260 and her LDL at 154.  Please verify again that she is taking her lipitor 10 mg daily.  If so, then we will need to increase to 20 mg daily. Ok to send in new Rx. Also her TSH level is elevated which indicated her thyroid is slightly underactive.  We want to verify this before we treat so I recommend she return to lab for repeat TSH and T4 level in 2 weeks. If still abnormal, we will need to start a thyroid medication.  Her thyroid activity is very important to her body's metabolic processes so we will need to keep this in check.

## 2015-01-22 NOTE — Telephone Encounter (Signed)
LMOM with contact name and number for return call RE: results and further provider instructions [informed to ask for clinical staff while provider & assistant out of office until Fri, 02.12.16]/SLS

## 2015-01-24 MED ORDER — ATORVASTATIN CALCIUM 20 MG PO TABS: 20.0000 mg | ORAL_TABLET | Freq: Every day | ORAL | Status: DC

## 2015-01-24 NOTE — Telephone Encounter (Signed)
Patient informed, understood & agreed; new Rx order to [facility] per pt request, future lab orders placed for [facility] per pt request/SLS

## 2015-01-24 NOTE — Telephone Encounter (Signed)
LMOM with contact name and number for return call RE: results and further provider instructions/SLS  

## 2015-01-24 NOTE — Telephone Encounter (Signed)
Caller name: Trishna, Cwik Relation to pt: self  Call back number: BEST# [phone]   Reason for call:  PT returning your call

## 2015-01-27 ENCOUNTER — Telehealth: Payer: Self-pay | Admitting: Physician Assistant

## 2015-01-27 NOTE — Telephone Encounter (Signed)
Caller name: Elder Love from [facility]  [phone]  Reason for call: rep from [facility] calling in reference to RX shipped atorvastatin 10 mg and then a request for 20 mg was sent in- notes show change in therapy - please advise which pt should be taking.

## 2015-01-28 ENCOUNTER — Other Ambulatory Visit: Payer: Self-pay | Admitting: Physician Assistant

## 2015-01-28 ENCOUNTER — Telehealth: Payer: Self-pay | Admitting: Physician Assistant

## 2015-01-28 DIAGNOSIS — R7989 Other specified abnormal findings of blood chemistry: Secondary | ICD-10-CM

## 2015-01-28 MED ORDER — ATORVASTATIN CALCIUM 20 MG PO TABS: 20.0000 mg | ORAL_TABLET | Freq: Every day | ORAL | Status: DC

## 2015-01-28 NOTE — Telephone Encounter (Signed)
New Rx resent with notes to pharmacy on order/SLS

## 2015-01-28 NOTE — Telephone Encounter (Signed)
Why are you waiting 2 weeks to have her thyroid retested ? can she do these labs this week as she is off work.  You are chedking t3 and t 4 are you checking anti tpo to see is she has an auto immune disease?  She wants this done as well.  When you put the code in for this it has to be  part of her physical so they will pay for the lab work.

## 2015-01-28 NOTE — Telephone Encounter (Signed)
Notified pt of below recommendation and she is agreeable to return to the [facility] on Friday for labs below. Has a lot of family stressors (parents failing health issues) and will be out of town with them next week. She will be available for f/u in the office if needed on 02/10/15.  Future lab orders already entered and in EPIC.

## 2015-01-28 NOTE — Telephone Encounter (Signed)
Its is just routine to wait 1-2 weeks to repeat test to confirm abnormal thyroid.  We can check T3 uptake and T4 at that time to further assess thyroid function.  I will add on labs to check for common autoimmune disease.  Unfortunately this cannot be billed with her physical as they are not preventive tests -- only TSH can be billed that way.  They will be billed with diagnosis of abnormal thyroid function.  The T3 and T4 will be covered.  The autoimmune tests should also be covered but cannot give 100% guarantee.  If she is wanting to come in this week, that will be fine, she just needs to make a lab appointment.

## 2015-01-31 ENCOUNTER — Other Ambulatory Visit (INDEPENDENT_AMBULATORY_CARE_PROVIDER_SITE_OTHER): Payer: BLUE CROSS/BLUE SHIELD

## 2015-01-31 DIAGNOSIS — R7989 Other specified abnormal findings of blood chemistry: Secondary | ICD-10-CM

## 2015-01-31 LAB — TSH: TSH: 2.9 u[IU]/mL (ref 0.35–4.50)

## 2015-01-31 LAB — T4, FREE: Free T4: 0.79 ng/dL (ref 0.60–1.60)

## 2015-01-31 LAB — T3 UPTAKE: T3 Uptake: 27 % (ref 22–35)

## 2015-02-01 LAB — THYROID PEROXIDASE ANTIBODY: Thyroperoxidase Ab SerPl-aCnc: 2 IU/mL (ref <9)

## 2015-02-04 NOTE — Addendum Note (Signed)
Addended by: Peggyann Shoals on: 02/04/2015 09:18 AM   Modules accepted: Orders

## 2015-02-05 ENCOUNTER — Telehealth: Payer: Self-pay | Admitting: Physician Assistant

## 2015-02-05 NOTE — Telephone Encounter (Signed)
SEE Results note.

## 2015-02-05 NOTE — Telephone Encounter (Signed)
Caller name: Tere, Mcconaughey Relation to pt: self  Call back number: [phone]   Reason for call:  Pt is inquring about lab results and liptor RX

## 2015-02-06 ENCOUNTER — Encounter: Payer: Self-pay | Admitting: Physician Assistant

## 2015-02-06 NOTE — Telephone Encounter (Signed)
Hi Tina Ryan -- please se MyChart message.  Can we call the number the patient has provided to clarify dosing instructions on Lipitor with her mail-order pharmacy?  Should be 20 mg Lipitor -- take one tablet daily.  90-day supply with 1 refill.

## 2015-07-04 ENCOUNTER — Telehealth: Payer: Self-pay | Admitting: Physician Assistant

## 2015-07-04 NOTE — Telephone Encounter (Signed)
Caller name: Alethea Relation to pt: Call back number: [phone] Pharmacy: [facility]  Reason for call:   Is requesting Amlodipine and losartan refills

## 2015-07-04 NOTE — Telephone Encounter (Signed)
Called and spoke with the pt and informed her that both meds was filled on (01/21/15) with refills.  Pt stated that she will check with [facility].//AB/CMA

## 2015-07-08 ENCOUNTER — Encounter: Payer: Self-pay | Admitting: Physician Assistant

## 2015-07-08 DIAGNOSIS — I10 Essential (primary) hypertension: Secondary | ICD-10-CM

## 2015-07-08 MED ORDER — AMLODIPINE BESYLATE 5 MG PO TABS: ORAL_TABLET | ORAL | Status: DC

## 2015-07-08 MED ORDER — LOSARTAN POTASSIUM 50 MG PO TABS: ORAL_TABLET | ORAL | Status: DC

## 2015-07-08 NOTE — Addendum Note (Signed)
Addended by: Raiford Noble on: 07/08/2015 04:00 PM   Modules accepted: Orders

## 2015-10-14 ENCOUNTER — Other Ambulatory Visit: Payer: Self-pay | Admitting: Family Medicine

## 2015-10-14 NOTE — Telephone Encounter (Signed)
Rx request to pharmacy/SLS Requested drug refills are authorized, however, the patient needs further evaluation and/or laboratory testing before further refills are given. Ask her to make an appointment for this.  Please call patient and schedule F/U appointment/SLS

## 2015-10-14 NOTE — Telephone Encounter (Signed)
Tina Ryan patient

## 2015-10-17 ENCOUNTER — Telehealth: Payer: Self-pay | Admitting: Physician Assistant

## 2015-10-17 NOTE — Telephone Encounter (Signed)
Unsure if patient is to be taking 10 mg and/or 20 mg; Please Advise/SLS

## 2015-10-17 NOTE — Telephone Encounter (Signed)
lvm advising patient of message below °

## 2015-10-17 NOTE — Telephone Encounter (Signed)
Ok to send in 3 months of medication. Then she needs follow-up appointment.

## 2015-10-17 NOTE — Telephone Encounter (Signed)
Relation to VU:DTHY Call back number: [phone]    Reason for call:  Patient states she has not been taken atorvastatin (LIPITOR) 20 MG tablet from July to October 20th, she just started taking 10 MG of LIPITOR within a few weeks and states PA may want her to have labs done but as per patient if she has not been taking medication lab will not show anything.Please advise patient directly

## 2015-10-19 NOTE — Telephone Encounter (Signed)
It looks that a prescription for 20 mg was sent in on 10/14/15 so she should have plenty of medication. Have her follow-up in 1-2 months after restarting medication and we can check labs then.

## 2015-10-20 ENCOUNTER — Other Ambulatory Visit: Payer: Self-pay

## 2015-10-20 DIAGNOSIS — Z1231 Encounter for screening mammogram for malignant neoplasm of breast: Secondary | ICD-10-CM

## 2015-10-20 NOTE — Telephone Encounter (Signed)
LMOM with contact name and number for return call RE: restarting medication and scheduling f/u appointment 1-2 months out afterwards [with fasting labs] per provider instructions/SLS

## 2015-11-25 ENCOUNTER — Ambulatory Visit
Admission: RE | Admit: 2015-11-25 | Discharge: 2015-11-25 | Disposition: A | Discharge status: Home / Self Care | Payer: BLUE CROSS/BLUE SHIELD | Source: Ambulatory Visit

## 2015-11-25 DIAGNOSIS — Z1231 Encounter for screening mammogram for malignant neoplasm of breast: Secondary | ICD-10-CM

## 2015-11-28 ENCOUNTER — Encounter: Payer: Self-pay | Admitting: *Deleted

## 2016-01-05 ENCOUNTER — Telehealth: Payer: Self-pay | Admitting: Physician Assistant

## 2016-01-05 DIAGNOSIS — I10 Essential (primary) hypertension: Secondary | ICD-10-CM

## 2016-01-05 MED ORDER — AMLODIPINE BESYLATE 5 MG PO TABS: ORAL_TABLET | ORAL | Status: DC

## 2016-01-05 MED ORDER — ATORVASTATIN CALCIUM 20 MG PO TABS: 20.0000 mg | ORAL_TABLET | Freq: Every day | ORAL | Status: DC

## 2016-01-05 MED ORDER — LOSARTAN POTASSIUM 50 MG PO TABS
ORAL_TABLET | ORAL | Status: DC
Start: 2016-01-05 — End: 2016-04-13

## 2016-01-05 NOTE — Telephone Encounter (Signed)
Pharmacy: [facility]) [place]  Reason for call: pt father passed away and mother has stage 4 lung cancer. She has been back and forth a lot recently. She scheduled cpe for 01/23/16 but will be out of meds 01/19/16. She uses mail order per [facility] and needs sent in 90 day supply. Pt will need atorvastatin, amlodipine, and losartan sent in.

## 2016-01-05 NOTE — Telephone Encounter (Signed)
90 day supply of each sent to [facility]. Left detailed message on pt's cell# re: Rx completion.

## 2016-01-22 ENCOUNTER — Telehealth: Payer: Self-pay | Admitting: Behavioral Health

## 2016-01-22 ENCOUNTER — Encounter: Payer: Self-pay | Admitting: Behavioral Health

## 2016-01-22 NOTE — Telephone Encounter (Signed)
Pre-Visit Call completed with patient and chart updated.   Pre-Visit Info documented in Specialty Comments under [facility].    

## 2016-01-23 ENCOUNTER — Encounter: Payer: Self-pay | Admitting: Physician Assistant

## 2016-01-23 ENCOUNTER — Ambulatory Visit (INDEPENDENT_AMBULATORY_CARE_PROVIDER_SITE_OTHER): Payer: BLUE CROSS/BLUE SHIELD | Admitting: Physician Assistant

## 2016-01-23 VITALS — BP 120/88 | HR 74 | Temp 97.9°F | Ht 67.0 in | Wt 198.4 lb

## 2016-01-23 DIAGNOSIS — E785 Hyperlipidemia, unspecified: Secondary | ICD-10-CM

## 2016-01-23 DIAGNOSIS — Z0000 Encounter for general adult medical examination without abnormal findings: Secondary | ICD-10-CM | POA: Diagnosis not present

## 2016-01-23 DIAGNOSIS — I10 Essential (primary) hypertension: Secondary | ICD-10-CM | POA: Diagnosis not present

## 2016-01-23 LAB — LIPID PANEL
CHOLESTEROL: 233 mg/dL — AB (ref 0–200)
HDL: 68.9 mg/dL (ref >39.00)
LDL CALC: 141 mg/dL — AB (ref 0–99)
NONHDL: 163.81
Total CHOL/HDL Ratio: 3
Triglycerides: 116 mg/dL (ref 0.0–149.0)
VLDL: 23.2 mg/dL (ref 0.0–40.0)

## 2016-01-23 LAB — URINALYSIS, ROUTINE W REFLEX MICROSCOPIC
Bilirubin Urine: NEGATIVE
KETONES UR: NEGATIVE
Leukocytes, UA: NEGATIVE
NITRITE: NEGATIVE
SPECIFIC GRAVITY, URINE: 1.015 (ref 1.000–1.030)
TOTAL PROTEIN, URINE-UPE24: NEGATIVE
URINE GLUCOSE: NEGATIVE
UROBILINOGEN UA: 0.2 (ref 0.0–1.0)
pH: 6.5 (ref 5.0–8.0)

## 2016-01-23 LAB — VITAMIN D 25 HYDROXY (VIT D DEFICIENCY, FRACTURES): VITD: 21.12 ng/mL — AB (ref 30.00–100.00)

## 2016-01-23 LAB — COMPREHENSIVE METABOLIC PANEL
ALBUMIN: 4.4 g/dL (ref 3.5–5.2)
ALK PHOS: 61 U/L (ref 39–117)
ALT: 30 U/L (ref 0–35)
AST: 20 U/L (ref 0–37)
BUN: 12 mg/dL (ref 6–23)
CO2: 28 mEq/L (ref 19–32)
Calcium: 9.7 mg/dL (ref 8.4–10.5)
Chloride: 102 mEq/L (ref 96–112)
Creatinine, Ser: 0.73 mg/dL (ref 0.40–1.20)
GFR: 86.76 mL/min (ref >60.00)
GLUCOSE: 119 mg/dL — AB (ref 70–99)
POTASSIUM: 4 meq/L (ref 3.5–5.1)
Sodium: 138 mEq/L (ref 135–145)
TOTAL PROTEIN: 7.6 g/dL (ref 6.0–8.3)
Total Bilirubin: 0.9 mg/dL (ref 0.2–1.2)

## 2016-01-23 LAB — TSH: TSH: 3.64 u[IU]/mL (ref 0.35–4.50)

## 2016-01-23 LAB — HEMOGLOBIN A1C: Hgb A1c MFr Bld: 5.7 % (ref 4.6–6.5)

## 2016-01-23 NOTE — Assessment & Plan Note (Signed)
Tolerating statin well. Will repeat Lipid panel and LFT today.

## 2016-01-23 NOTE — Assessment & Plan Note (Signed)
Controlled. Asymptomatic. Will obtain CMP today.

## 2016-01-23 NOTE — Progress Notes (Signed)
Pre visit review using our clinic review tool, if applicable. No additional management support is needed unless otherwise documented below in the visit note. 

## 2016-01-23 NOTE — Patient Instructions (Signed)
Please go to the lab for blood work.  I will call you with your results. If your blood work is normal we will follow-up yearly for physicals.  If anything is abnormal we will treat and schedule a follow-up.   Preventive Care for Adults, Female A healthy lifestyle and preventive care can promote health and wellness. Preventive health guidelines for women include the following key practices.  A routine yearly physical is a good way to check with your health care provider about your health and preventive screening. It is a chance to share any concerns and updates on your health and to receive a thorough exam.  Visit your dentist for a routine exam and preventive care every 6 months. Brush your teeth twice a day and floss once a day. Good oral hygiene prevents tooth decay and gum disease.  The frequency of eye exams is based on your age, health, family medical history, use of contact lenses, and other factors. Follow your health care provider's recommendations for frequency of eye exams.  Eat a healthy diet. Foods like vegetables, fruits, whole grains, low-fat dairy products, and lean protein foods contain the nutrients you need without too many calories. Decrease your intake of foods high in solid fats, added sugars, and salt. Eat the right amount of calories for you.Get information about a proper diet from your health care provider, if necessary.  Regular physical exercise is one of the most important things you can do for your health. Most adults should get at least 150 minutes of moderate-intensity exercise (any activity that increases your heart rate and causes you to sweat) each week. In addition, most adults need muscle-strengthening exercises on 2 or more days a week.  Maintain a healthy weight. The body mass index (BMI) is a screening tool to identify possible weight problems. It provides an estimate of body fat based on height and weight. Your health care provider can find your BMI and can  help you achieve or maintain a healthy weight.For adults 20 years and older:  A BMI below 18.5 is considered underweight.  A BMI of 18.5 to 24.9 is normal.  A BMI of 25 to 29.9 is considered overweight.  A BMI of 30 and above is considered obese.  Maintain normal blood lipids and cholesterol levels by exercising and minimizing your intake of saturated fat. Eat a balanced diet with plenty of fruit and vegetables. Blood tests for lipids and cholesterol should begin at age 60 and be repeated every 5 years. If your lipid or cholesterol levels are high, you are over 50, or you are at high risk for heart disease, you may need your cholesterol levels checked more frequently.Ongoing high lipid and cholesterol levels should be treated with medicines if diet and exercise are not working.  If you smoke, find out from your health care provider how to quit. If you do not use tobacco, do not start.  Lung cancer screening is recommended for adults aged 55-80 years who are at high risk for developing lung cancer because of a history of smoking. A yearly low-dose CT scan of the lungs is recommended for people who have at least a 30-pack-year history of smoking and are a current smoker or have quit within the past 15 years. A pack year of smoking is smoking an average of 1 pack of cigarettes a day for 1 year (for example: 1 pack a day for 30 years or 2 packs a day for 15 years). Yearly screening should continue until the  smoker has stopped smoking for at least 15 years. Yearly screening should be stopped for people who develop a health problem that would prevent them from having lung cancer treatment.  If you are pregnant, do not drink alcohol. If you are breastfeeding, be very cautious about drinking alcohol. If you are not pregnant and choose to drink alcohol, do not have more than 1 drink per day. One drink is considered to be 12 ounces (355 mL) of beer, 5 ounces (148 mL) of wine, or 1.5 ounces (44 mL) of  liquor.  Avoid use of street drugs. Do not share needles with anyone. Ask for help if you need support or instructions about stopping the use of drugs.  High blood pressure causes heart disease and increases the risk of stroke. Your blood pressure should be checked at least every 1 to 2 years. Ongoing high blood pressure should be treated with medicines if weight loss and exercise do not work.  If you are 18-72 years old, ask your health care provider if you should take aspirin to prevent strokes.  Diabetes screening is done by taking a blood sample to check your blood glucose level after you have not eaten for a certain period of time (fasting). If you are not overweight and you do not have risk factors for diabetes, you should be screened once every 3 years starting at age 6. If you are overweight or obese and you are 48-83 years of age, you should be screened for diabetes every year as part of your cardiovascular risk assessment.  Breast cancer screening is essential preventive care for women. You should practice "breast self-awareness." This means understanding the normal appearance and feel of your breasts and may include breast self-examination. Any changes detected, no matter how small, should be reported to a health care provider. Women in their 10s and 30s should have a clinical breast exam (CBE) by a health care provider as part of a regular health exam every 1 to 3 years. After age 43, women should have a CBE every year. Starting at age 30, women should consider having a mammogram (breast X-ray test) every year. Women who have a family history of breast cancer should talk to their health care provider about genetic screening. Women at a high risk of breast cancer should talk to their health care providers about having an MRI and a mammogram every year.  Breast cancer gene (BRCA)-related cancer risk assessment is recommended for women who have family members with BRCA-related cancers.  BRCA-related cancers include breast, ovarian, tubal, and peritoneal cancers. Having family members with these cancers may be associated with an increased risk for harmful changes (mutations) in the breast cancer genes BRCA1 and BRCA2. Results of the assessment will determine the need for genetic counseling and BRCA1 and BRCA2 testing.  Your health care provider may recommend that you be screened regularly for cancer of the pelvic organs (ovaries, uterus, and vagina). This screening involves a pelvic examination, including checking for microscopic changes to the surface of your cervix (Pap test). You may be encouraged to have this screening done every 3 years, beginning at age 77.  For women ages 67-65, health care providers may recommend pelvic exams and Pap testing every 3 years, or they may recommend the Pap and pelvic exam, combined with testing for human papilloma virus (HPV), every 5 years. Some types of HPV increase your risk of cervical cancer. Testing for HPV may also be done on women of any age with unclear Pap  test results.  Other health care providers may not recommend any screening for nonpregnant women who are considered low risk for pelvic cancer and who do not have symptoms. Ask your health care provider if a screening pelvic exam is right for you.  If you have had past treatment for cervical cancer or a condition that could lead to cancer, you need Pap tests and screening for cancer for at least 20 years after your treatment. If Pap tests have been discontinued, your risk factors (such as having a new sexual partner) need to be reassessed to determine if screening should resume. Some women have medical problems that increase the chance of getting cervical cancer. In these cases, your health care provider may recommend more frequent screening and Pap tests.  Colorectal cancer can be detected and often prevented. Most routine colorectal cancer screening begins at the age of 53 years and  continues through age 73 years. However, your health care provider may recommend screening at an earlier age if you have risk factors for colon cancer. On a yearly basis, your health care provider may provide home test kits to check for hidden blood in the stool. Use of a small camera at the end of a tube, to directly examine the colon (sigmoidoscopy or colonoscopy), can detect the earliest forms of colorectal cancer. Talk to your health care provider about this at age 38, when routine screening begins. Direct exam of the colon should be repeated every 5-10 years through age 92 years, unless early forms of precancerous polyps or small growths are found.  People who are at an increased risk for hepatitis B should be screened for this virus. You are considered at high risk for hepatitis B if:  You were born in a country where hepatitis B occurs often. Talk with your health care provider about which countries are considered high risk.  Your parents were born in a high-risk country and you have not received a shot to protect against hepatitis B (hepatitis B vaccine).  You have HIV or AIDS.  You use needles to inject street drugs.  You live with, or have sex with, someone who has hepatitis B.  You get hemodialysis treatment.  You take certain medicines for conditions like cancer, organ transplantation, and autoimmune conditions.  Hepatitis C blood testing is recommended for all people born from [birth year] through 1964 and any individual with known risks for hepatitis C.  Practice safe sex. Use condoms and avoid high-risk sexual practices to reduce the spread of sexually transmitted infections (STIs). STIs include gonorrhea, chlamydia, syphilis, trichomonas, herpes, HPV, and human immunodeficiency virus (HIV). Herpes, HIV, and HPV are viral illnesses that have no cure. They can result in disability, cancer, and death.  You should be screened for sexually transmitted illnesses (STIs) including gonorrhea  and chlamydia if:  You are sexually active and are younger than 24 years.  You are older than 24 years and your health care provider tells you that you are at risk for this type of infection.  Your sexual activity has changed since you were last screened and you are at an increased risk for chlamydia or gonorrhea. Ask your health care provider if you are at risk.  If you are at risk of being infected with HIV, it is recommended that you take a prescription medicine daily to prevent HIV infection. This is called preexposure prophylaxis (PrEP). You are considered at risk if:  You are sexually active and do not regularly use condoms or know the  HIV status of your partner(s).  You take drugs by injection.  You are sexually active with a partner who has HIV.  Talk with your health care provider about whether you are at high risk of being infected with HIV. If you choose to begin PrEP, you should first be tested for HIV. You should then be tested every 3 months for as long as you are taking PrEP.  Osteoporosis is a disease in which the bones lose minerals and strength with aging. This can result in serious bone fractures or breaks. The risk of osteoporosis can be identified using a bone density scan. Women ages 61 years and over and women at risk for fractures or osteoporosis should discuss screening with their health care providers. Ask your health care provider whether you should take a calcium supplement or vitamin D to reduce the rate of osteoporosis.  Menopause can be associated with physical symptoms and risks. Hormone replacement therapy is available to decrease symptoms and risks. You should talk to your health care provider about whether hormone replacement therapy is right for you.  Use sunscreen. Apply sunscreen liberally and repeatedly throughout the day. You should seek shade when your shadow is shorter than you. Protect yourself by wearing long sleeves, pants, a wide-brimmed hat, and  sunglasses year round, whenever you are outdoors.  Once a month, do a whole body skin exam, using a mirror to look at the skin on your back. Tell your health care provider of new moles, moles that have irregular borders, moles that are larger than a pencil eraser, or moles that have changed in shape or color.  Stay current with required vaccines (immunizations).  Influenza vaccine. All adults should be immunized every year.  Tetanus, diphtheria, and acellular pertussis (Td, Tdap) vaccine. Pregnant women should receive 1 dose of Tdap vaccine during each pregnancy. The dose should be obtained regardless of the length of time since the last dose. Immunization is preferred during the 27th-36th week of gestation. An adult who has not previously received Tdap or who does not know her vaccine status should receive 1 dose of Tdap. This initial dose should be followed by tetanus and diphtheria toxoids (Td) booster doses every 10 years. Adults with an unknown or incomplete history of completing a 3-dose immunization series with Td-containing vaccines should begin or complete a primary immunization series including a Tdap dose. Adults should receive a Td booster every 10 years.  Varicella vaccine. An adult without evidence of immunity to varicella should receive 2 doses or a second dose if she has previously received 1 dose. Pregnant females who do not have evidence of immunity should receive the first dose after pregnancy. This first dose should be obtained before leaving the health care facility. The second dose should be obtained 4-8 weeks after the first dose.  Human papillomavirus (HPV) vaccine. Females aged 13-26 years who have not received the vaccine previously should obtain the 3-dose series. The vaccine is not recommended for use in pregnant females. However, pregnancy testing is not needed before receiving a dose. If a female is found to be pregnant after receiving a dose, no treatment is needed. In that  case, the remaining doses should be delayed until after the pregnancy. Immunization is recommended for any person with an immunocompromised condition through the age of 59 years if she did not get any or all doses earlier. During the 3-dose series, the second dose should be obtained 4-8 weeks after the first dose. The third dose should be  obtained 24 weeks after the first dose and 16 weeks after the second dose.  Zoster vaccine. One dose is recommended for adults aged 51 years or older unless certain conditions are present.  Measles, mumps, and rubella (MMR) vaccine. Adults born before [birth year] generally are considered immune to measles and mumps. Adults born in [birth year] or later should have 1 or more doses of MMR vaccine unless there is a contraindication to the vaccine or there is laboratory evidence of immunity to each of the three diseases. A routine second dose of MMR vaccine should be obtained at least 28 days after the first dose for students attending postsecondary schools, health care workers, or international travelers. People who received inactivated measles vaccine or an unknown type of measles vaccine during 1963-1967 should receive 2 doses of MMR vaccine. People who received inactivated mumps vaccine or an unknown type of mumps vaccine before 1979 and are at high risk for mumps infection should consider immunization with 2 doses of MMR vaccine. For females of childbearing age, rubella immunity should be determined. If there is no evidence of immunity, females who are not pregnant should be vaccinated. If there is no evidence of immunity, females who are pregnant should delay immunization until after pregnancy. Unvaccinated health care workers born before [birth year] who lack laboratory evidence of measles, mumps, or rubella immunity or laboratory confirmation of disease should consider measles and mumps immunization with 2 doses of MMR vaccine or rubella immunization with 1 dose of MMR vaccine.  Pneumococcal  13-valent conjugate (PCV13) vaccine. When indicated, a person who is uncertain of his immunization history and has no record of immunization should receive the PCV13 vaccine. All adults 30 years of age and older should receive this vaccine. An adult aged 50 years or older who has certain medical conditions and has not been previously immunized should receive 1 dose of PCV13 vaccine. This PCV13 should be followed with a dose of pneumococcal polysaccharide (PPSV23) vaccine. Adults who are at high risk for pneumococcal disease should obtain the PPSV23 vaccine at least 8 weeks after the dose of PCV13 vaccine. Adults older than 59 years of age who have normal immune system function should obtain the PPSV23 vaccine dose at least 1 year after the dose of PCV13 vaccine.  Pneumococcal polysaccharide (PPSV23) vaccine. When PCV13 is also indicated, PCV13 should be obtained first. All adults aged 16 years and older should be immunized. An adult younger than age 68 years who has certain medical conditions should be immunized. Any person who resides in a nursing home or long-term care facility should be immunized. An adult smoker should be immunized. People with an immunocompromised condition and certain other conditions should receive both PCV13 and PPSV23 vaccines. People with human immunodeficiency virus (HIV) infection should be immunized as soon as possible after diagnosis. Immunization during chemotherapy or radiation therapy should be avoided. Routine use of PPSV23 vaccine is not recommended for American Indians, [place] Natives, or people younger than 65 years unless there are medical conditions that require PPSV23 vaccine. When indicated, people who have unknown immunization and have no record of immunization should receive PPSV23 vaccine. One-time revaccination 5 years after the first dose of PPSV23 is recommended for people aged 19-64 years who have chronic kidney failure, nephrotic syndrome, asplenia, or  immunocompromised conditions. People who received 1-2 doses of PPSV23 before age 85 years should receive another dose of PPSV23 vaccine at age 70 years or later if at least 5 years have passed since the previous dose. Doses  of PPSV23 are not needed for people immunized with PPSV23 at or after age 26 years.  Meningococcal vaccine. Adults with asplenia or persistent complement component deficiencies should receive 2 doses of quadrivalent meningococcal conjugate (MenACWY-D) vaccine. The doses should be obtained at least 2 months apart. Microbiologists working with certain meningococcal bacteria, [employer] recruits, people at risk during an outbreak, and people who travel to or live in countries with a high rate of meningitis should be immunized. A first-year college student up through age 2 years who is living in a residence hall should receive a dose if she did not receive a dose on or after her 16th birthday. Adults who have certain high-risk conditions should receive one or more doses of vaccine.  Hepatitis A vaccine. Adults who wish to be protected from this disease, have certain high-risk conditions, work with hepatitis A-infected animals, work in hepatitis A research labs, or travel to or work in countries with a high rate of hepatitis A should be immunized. Adults who were previously unvaccinated and who anticipate close contact with an international adoptee during the first 60 days after arrival in the [place] from a country with a high rate of hepatitis A should be immunized.  Hepatitis B vaccine. Adults who wish to be protected from this disease, have certain high-risk conditions, may be exposed to blood or other infectious body fluids, are household contacts or sex partners of hepatitis B positive people, are clients or workers in certain care facilities, or travel to or work in countries with a high rate of hepatitis B should be immunized.  Haemophilus influenzae type b (Hib) vaccine. A  previously unvaccinated person with asplenia or sickle cell disease or having a scheduled splenectomy should receive 1 dose of Hib vaccine. Regardless of previous immunization, a recipient of a hematopoietic stem cell transplant should receive a 3-dose series 6-12 months after her successful transplant. Hib vaccine is not recommended for adults with HIV infection. Preventive Services / Frequency Ages 58 to 40 years  Blood pressure check.** / Every 3-5 years.  Lipid and cholesterol check.** / Every 5 years beginning at age 67.  Clinical breast exam.** / Every 3 years for women in their 22s and 21s.  BRCA-related cancer risk assessment.** / For women who have family members with a BRCA-related cancer (breast, ovarian, tubal, or peritoneal cancers).  Pap test.** / Every 2 years from ages 75 through 45. Every 3 years starting at age 14 through age 70 or 37 with a history of 3 consecutive normal Pap tests.  HPV screening.** / Every 3 years from ages 19 through ages 62 to 44 with a history of 3 consecutive normal Pap tests.  Hepatitis C blood test.** / For any individual with known risks for hepatitis C.  Skin self-exam. / Monthly.  Influenza vaccine. / Every year.  Tetanus, diphtheria, and acellular pertussis (Tdap, Td) vaccine.** / Consult your health care provider. Pregnant women should receive 1 dose of Tdap vaccine during each pregnancy. 1 dose of Td every 10 years.  Varicella vaccine.** / Consult your health care provider. Pregnant females who do not have evidence of immunity should receive the first dose after pregnancy.  HPV vaccine. / 3 doses over 6 months, if 74 and younger. The vaccine is not recommended for use in pregnant females. However, pregnancy testing is not needed before receiving a dose.  Measles, mumps, rubella (MMR) vaccine.** / You need at least 1 dose of MMR if you were born in 1957 or later.  You may also need a 2nd dose. For females of childbearing age, rubella  immunity should be determined. If there is no evidence of immunity, females who are not pregnant should be vaccinated. If there is no evidence of immunity, females who are pregnant should delay immunization until after pregnancy.  Pneumococcal 13-valent conjugate (PCV13) vaccine.** / Consult your health care provider.  Pneumococcal polysaccharide (PPSV23) vaccine.** / 1 to 2 doses if you smoke cigarettes or if you have certain conditions.  Meningococcal vaccine.** / 1 dose if you are age 50 to 92 years and a Market researcher living in a residence hall, or have one of several medical conditions, you need to get vaccinated against meningococcal disease. You may also need additional booster doses.  Hepatitis A vaccine.** / Consult your health care provider.  Hepatitis B vaccine.** / Consult your health care provider.  Haemophilus influenzae type b (Hib) vaccine.** / Consult your health care provider. Ages 51 to 57 years  Blood pressure check.** / Every year.  Lipid and cholesterol check.** / Every 5 years beginning at age 28 years.  Lung cancer screening. / Every year if you are aged 47-80 years and have a 30-pack-year history of smoking and currently smoke or have quit within the past 15 years. Yearly screening is stopped once you have quit smoking for at least 15 years or develop a health problem that would prevent you from having lung cancer treatment.  Clinical breast exam.** / Every year after age 36 years.  BRCA-related cancer risk assessment.** / For women who have family members with a BRCA-related cancer (breast, ovarian, tubal, or peritoneal cancers).  Mammogram.** / Every year beginning at age 72 years and continuing for as long as you are in good health. Consult with your health care provider.  Pap test.** / Every 3 years starting at age 35 years through age 42 or 53 years with a history of 3 consecutive normal Pap tests.  HPV screening.** / Every 3 years from ages 60  years through ages 22 to 79 years with a history of 3 consecutive normal Pap tests.  Fecal occult blood test (FOBT) of stool. / Every year beginning at age 39 years and continuing until age 39 years. You may not need to do this test if you get a colonoscopy every 10 years.  Flexible sigmoidoscopy or colonoscopy.** / Every 5 years for a flexible sigmoidoscopy or every 10 years for a colonoscopy beginning at age 30 years and continuing until age 23 years.  Hepatitis C blood test.** / For all people born from [birth year] through 1964 and any individual with known risks for hepatitis C.  Skin self-exam. / Monthly.  Influenza vaccine. / Every year.  Tetanus, diphtheria, and acellular pertussis (Tdap/Td) vaccine.** / Consult your health care provider. Pregnant women should receive 1 dose of Tdap vaccine during each pregnancy. 1 dose of Td every 10 years.  Varicella vaccine.** / Consult your health care provider. Pregnant females who do not have evidence of immunity should receive the first dose after pregnancy.  Zoster vaccine.** / 1 dose for adults aged 1 years or older.  Measles, mumps, rubella (MMR) vaccine.** / You need at least 1 dose of MMR if you were born in 1957 or later. You may also need a second dose. For females of childbearing age, rubella immunity should be determined. If there is no evidence of immunity, females who are not pregnant should be vaccinated. If there is no evidence of immunity, females who are pregnant  should delay immunization until after pregnancy.  Pneumococcal 13-valent conjugate (PCV13) vaccine.** / Consult your health care provider.  Pneumococcal polysaccharide (PPSV23) vaccine.** / 1 to 2 doses if you smoke cigarettes or if you have certain conditions.  Meningococcal vaccine.** / Consult your health care provider.  Hepatitis A vaccine.** / Consult your health care provider.  Hepatitis B vaccine.** / Consult your health care provider.  Haemophilus influenzae type  b (Hib) vaccine.** / Consult your health care provider. Ages 76 years and over  Blood pressure check.** / Every year.  Lipid and cholesterol check.** / Every 5 years beginning at age 29 years.  Lung cancer screening. / Every year if you are aged 48-80 years and have a 30-pack-year history of smoking and currently smoke or have quit within the past 15 years. Yearly screening is stopped once you have quit smoking for at least 15 years or develop a health problem that would prevent you from having lung cancer treatment.  Clinical breast exam.** / Every year after age 23 years.  BRCA-related cancer risk assessment.** / For women who have family members with a BRCA-related cancer (breast, ovarian, tubal, or peritoneal cancers).  Mammogram.** / Every year beginning at age 19 years and continuing for as long as you are in good health. Consult with your health care provider.  Pap test.** / Every 3 years starting at age 3 years through age 22 or 90 years with 3 consecutive normal Pap tests. Testing can be stopped between 65 and 70 years with 3 consecutive normal Pap tests and no abnormal Pap or HPV tests in the past 10 years.  HPV screening.** / Every 3 years from ages 73 years through ages 52 or 74 years with a history of 3 consecutive normal Pap tests. Testing can be stopped between 65 and 70 years with 3 consecutive normal Pap tests and no abnormal Pap or HPV tests in the past 10 years.  Fecal occult blood test (FOBT) of stool. / Every year beginning at age 2 years and continuing until age 64 years. You may not need to do this test if you get a colonoscopy every 10 years.  Flexible sigmoidoscopy or colonoscopy.** / Every 5 years for a flexible sigmoidoscopy or every 10 years for a colonoscopy beginning at age 51 years and continuing until age 59 years.  Hepatitis C blood test.** / For all people born from [birth year] through 1964 and any individual with known risks for hepatitis C.  Osteoporosis  screening.** / A one-time screening for women ages 64 years and over and women at risk for fractures or osteoporosis.  Skin self-exam. / Monthly.  Influenza vaccine. / Every year.  Tetanus, diphtheria, and acellular pertussis (Tdap/Td) vaccine.** / 1 dose of Td every 10 years.  Varicella vaccine.** / Consult your health care provider.  Zoster vaccine.** / 1 dose for adults aged 58 years or older.  Pneumococcal 13-valent conjugate (PCV13) vaccine.** / Consult your health care provider.  Pneumococcal polysaccharide (PPSV23) vaccine.** / 1 dose for all adults aged 2 years and older.  Meningococcal vaccine.** / Consult your health care provider.  Hepatitis A vaccine.** / Consult your health care provider.  Hepatitis B vaccine.** / Consult your health care provider.  Haemophilus influenzae type b (Hib) vaccine.** / Consult your health care provider. ** Family history and personal history of risk and conditions may change your health care provider's recommendations.   This information is not intended to replace advice given to you by your health care provider. Make  sure you discuss any questions you have with your health care provider.   Document Released: 01/25/2002 Document Revised: 12/20/2014 Document Reviewed: 04/26/2011 Elsevier Interactive Patient Education [employer].

## 2016-01-23 NOTE — Assessment & Plan Note (Signed)
Depression screen negative. Health Maintenance reviewed -- All parameters up-to-date. See EMR for dates of each HM topic. Preventive schedule discussed and handout given in AVS. Will obtain fasting labs today.

## 2016-01-23 NOTE — Progress Notes (Signed)
Patient presents to clinic today for annual exam.  Patient is fasting for labs.  Acute Concerns: Denies acute concerns today.  Chronic Issues: Hypertension -- Endorses taking her amlodipine and losartan as directed. Patient denies chest pain, palpitations, lightheadedness, dizziness, vision changes or frequent headaches.  BP Readings from Last 3 Encounters:  01/23/16 120/88  01/21/15 127/90  01/18/14 136/84   Hyperlipidemia -- Endorses taking lipitor as directed for cholesterol. Denies side effects of medication. Is due for repeat labs.  Health Maintenance: Immunizations -- up-to-date Colonoscopy -- up-to-date Mammogram -- up-to-date PAP -- up-to-date  Past Medical History  Diagnosis Date  . Hyperlipidemia   . Hypertension   . Hx of adenomatous colonic polyps 2004 & 2007  . Diverticulitis 2009 & 2011  . Allergy     seasonal & animal triggers  . Asthma     Past Surgical History  Procedure Laterality Date  . Appendectomy    . Breast biopsy  2005  . Anterior cruciate ligament repair  1995  . Colonoscopy with polypectomy      Dr Cristina Gong    Current Outpatient Prescriptions on File Prior to Visit  Medication Sig Dispense Refill  . albuterol (PROVENTIL HFA;VENTOLIN HFA) 108 (90 BASE) MCG/ACT inhaler Inhale 2 puffs into the lungs every 6 (six) hours as needed for wheezing or shortness of breath. 1 Inhaler 2  . amLODipine (NORVASC) 5 MG tablet TAKE 1 BY MOUTH DAILY 90 tablet 0  . atorvastatin (LIPITOR) 20 MG tablet Take 1 tablet (20 mg total) by mouth daily. 90 tablet 0  . clobetasol cream (TEMOVATE) [id number] % Apply 1 application topically 2 (two) times daily. 60 g 2  . losartan (COZAAR) 50 MG tablet TAKE 1 BY MOUTH DAILY 90 tablet 0  . Multiple Vitamins-Minerals (MULTIVITAMIN GUMMIES ADULT PO) Take by mouth daily.     No current facility-administered medications on file prior to visit.    Allergies  Allergen Reactions  . Codeine     Vomitting  . Lisinopril    cough    Family History  Problem Relation Age of Onset  . Colon cancer Father   . Stroke Father   . Heart attack Maternal Aunt 46  . Heart attack Paternal Aunt 22    Bullous Pemphigoid; Hamman - Rich Syndrome  . Hyperlipidemia      5 M aunts  . Thyroid disease      4 M aunts  . Hypertension      multiple M aunts   . Breast cancer      46 M aunts  . Hyperlipidemia Mother   . Breast cancer Mother   . Lung cancer Mother     2 lobectomies ; radiation  . GER disease Son   . Stroke Maternal Grandmother   . Diabetes Neg Hx     Social History   Social History  . Marital Status: Single    Spouse Name: N/A  . Number of Children: N/A  . Years of Education: N/A   Occupational History  . Not on file.   Social History Main Topics  . Smoking status: Never Smoker   . Smokeless tobacco: Not on file  . Alcohol Use: 3.0 oz/week    5 Glasses of wine per week     Comment:  socially  . Drug Use: No  . Sexual Activity: Not Currently   Other Topics Concern  . Not on file   Social History Narrative   Review of Systems  Constitutional: Negative  for fever and weight loss.  HENT: Negative for ear discharge, ear pain, hearing loss and tinnitus.   Eyes: Negative for blurred vision, double vision, photophobia and pain.  Respiratory: Negative for cough and shortness of breath.   Cardiovascular: Negative for chest pain and palpitations.  Gastrointestinal: Negative for heartburn, nausea, vomiting, abdominal pain, diarrhea, constipation, blood in stool and melena.  Genitourinary: Negative for dysuria, urgency, frequency, hematuria and flank pain.  Musculoskeletal: Negative for falls.  Neurological: Negative for dizziness, loss of consciousness and headaches.  Endo/Heme/Allergies: Negative for environmental allergies.  Psychiatric/Behavioral: Negative for depression, suicidal ideas, hallucinations and substance abuse. The patient is not nervous/anxious and does not have insomnia.    BP  120/88 mmHg  Pulse 74  Temp(Src) 97.9 F (36.6 C) (Oral)  Ht 5\' 7"  (1.702 m)  Wt 198 lb 6.4 oz (89.994 kg)  BMI 31.07 kg/m2  SpO2 99%  Physical Exam  Constitutional: She is oriented to person, place, and time and well-developed, well-nourished, and in no distress.  HENT:  Head: Normocephalic and atraumatic.  Right Ear: Tympanic membrane, external ear and ear canal normal.  Left Ear: Tympanic membrane, external ear and ear canal normal.  Nose: Nose normal. No mucosal edema.  Mouth/Throat: Uvula is midline, oropharynx is clear and moist and mucous membranes are normal. No oropharyngeal exudate or posterior oropharyngeal erythema.  Eyes: Conjunctivae are normal. Pupils are equal, round, and reactive to light.  Neck: Neck supple. No thyromegaly present.  Cardiovascular: Normal rate, regular rhythm, normal heart sounds and intact distal pulses.   Pulmonary/Chest: Effort normal and breath sounds normal. No respiratory distress. She has no wheezes. She has no rales.  Abdominal: Soft. Bowel sounds are normal. She exhibits no distension and no mass. There is no tenderness. There is no rebound and no guarding.  Lymphadenopathy:    She has no cervical adenopathy.  Neurological: She is alert and oriented to person, place, and time. No cranial nerve deficit.  Skin: Skin is warm and dry. No rash noted.  Psychiatric: Affect normal.  Vitals reviewed.   No results found for this or any previous visit (from the past 2160 hour(s)).  Assessment/Plan: Essential hypertension Controlled. Asymptomatic. Will obtain CMP today.  Hyperlipidemia Tolerating statin well. Will repeat Lipid panel and LFT today.  Visit for preventive health examination Depression screen negative. Health Maintenance reviewed -- All parameters up-to-date. See EMR for dates of each HM topic. Preventive schedule discussed and handout given in AVS. Will obtain fasting labs today.

## 2016-01-24 LAB — CBC
HEMATOCRIT: 44.7 % (ref 36.0–46.0)
HEMOGLOBIN: 14.3 g/dL (ref 12.0–15.0)
MCHC: 32 g/dL (ref 30.0–36.0)
MCV: 99.8 fl (ref 78.0–100.0)
Platelets: 405 10*3/uL — ABNORMAL HIGH (ref 150.0–400.0)
RBC: 4.48 Mil/uL (ref 3.87–5.11)
RDW: 13.6 % (ref 11.5–15.5)
WBC: 7.1 10*3/uL (ref 4.0–10.5)

## 2016-03-31 DIAGNOSIS — Z6830 Body mass index (BMI) 30.0-30.9, adult: Secondary | ICD-10-CM | POA: Diagnosis not present

## 2016-03-31 DIAGNOSIS — Z01419 Encounter for gynecological examination (general) (routine) without abnormal findings: Secondary | ICD-10-CM | POA: Diagnosis not present

## 2016-04-13 ENCOUNTER — Encounter: Payer: Self-pay | Admitting: Physician Assistant

## 2016-04-13 ENCOUNTER — Other Ambulatory Visit: Payer: Self-pay | Admitting: Physician Assistant

## 2016-04-13 DIAGNOSIS — I10 Essential (primary) hypertension: Secondary | ICD-10-CM

## 2016-04-13 MED ORDER — LOSARTAN POTASSIUM 50 MG PO TABS: ORAL_TABLET | ORAL | Status: DC

## 2016-04-13 MED ORDER — AMLODIPINE BESYLATE 5 MG PO TABS: ORAL_TABLET | ORAL | Status: DC

## 2016-04-13 MED ORDER — ATORVASTATIN CALCIUM 20 MG PO TABS: 20.0000 mg | ORAL_TABLET | Freq: Every day | ORAL | Status: DC

## 2016-04-13 NOTE — Telephone Encounter (Signed)
Rx request to pharmacy/[facility]  

## 2016-07-23 ENCOUNTER — Ambulatory Visit: Payer: BLUE CROSS/BLUE SHIELD | Admitting: Physician Assistant

## 2016-09-30 DIAGNOSIS — J45901 Unspecified asthma with (acute) exacerbation: Secondary | ICD-10-CM | POA: Diagnosis not present

## 2016-09-30 DIAGNOSIS — Z6831 Body mass index (BMI) 31.0-31.9, adult: Secondary | ICD-10-CM | POA: Diagnosis not present

## 2016-09-30 DIAGNOSIS — J209 Acute bronchitis, unspecified: Secondary | ICD-10-CM | POA: Diagnosis not present

## 2016-10-19 ENCOUNTER — Other Ambulatory Visit: Payer: Self-pay | Admitting: Obstetrics and Gynecology

## 2016-10-19 DIAGNOSIS — Z1231 Encounter for screening mammogram for malignant neoplasm of breast: Secondary | ICD-10-CM

## 2016-11-25 ENCOUNTER — Ambulatory Visit
Admission: RE | Admit: 2016-11-25 | Discharge: 2016-11-25 | Disposition: A | Discharge status: Home / Self Care | Payer: BLUE CROSS/BLUE SHIELD | Source: Ambulatory Visit | Attending: Obstetrics and Gynecology | Admitting: Obstetrics and Gynecology

## 2016-11-25 DIAGNOSIS — Z1231 Encounter for screening mammogram for malignant neoplasm of breast: Secondary | ICD-10-CM

## 2016-12-14 DIAGNOSIS — Z23 Encounter for immunization: Secondary | ICD-10-CM | POA: Diagnosis not present

## 2017-04-21 DIAGNOSIS — H16223 Keratoconjunctivitis sicca, not specified as Sjogren's, bilateral: Secondary | ICD-10-CM | POA: Diagnosis not present

## 2017-05-12 DIAGNOSIS — J208 Acute bronchitis due to other specified organisms: Secondary | ICD-10-CM | POA: Diagnosis not present

## 2017-05-13 DIAGNOSIS — M79644 Pain in right finger(s): Secondary | ICD-10-CM | POA: Diagnosis not present

## 2017-05-13 DIAGNOSIS — S66911A Strain of unspecified muscle, fascia and tendon at wrist and hand level, right hand, initial encounter: Secondary | ICD-10-CM | POA: Diagnosis not present

## 2017-05-13 DIAGNOSIS — M25531 Pain in right wrist: Secondary | ICD-10-CM | POA: Diagnosis not present

## 2017-05-13 DIAGNOSIS — S63501A Unspecified sprain of right wrist, initial encounter: Secondary | ICD-10-CM | POA: Diagnosis not present

## 2017-06-07 ENCOUNTER — Encounter: Payer: Self-pay | Admitting: Physician Assistant

## 2017-06-07 ENCOUNTER — Ambulatory Visit (INDEPENDENT_AMBULATORY_CARE_PROVIDER_SITE_OTHER): Payer: BLUE CROSS/BLUE SHIELD | Admitting: Physician Assistant

## 2017-06-07 VITALS — BP 138/80 | HR 77 | Temp 98.8°F | Resp 14 | Ht 67.0 in | Wt 203.0 lb

## 2017-06-07 DIAGNOSIS — Z1159 Encounter for screening for other viral diseases: Secondary | ICD-10-CM | POA: Diagnosis not present

## 2017-06-07 DIAGNOSIS — E785 Hyperlipidemia, unspecified: Secondary | ICD-10-CM | POA: Diagnosis not present

## 2017-06-07 DIAGNOSIS — I10 Essential (primary) hypertension: Secondary | ICD-10-CM | POA: Diagnosis not present

## 2017-06-07 DIAGNOSIS — Z0000 Encounter for general adult medical examination without abnormal findings: Secondary | ICD-10-CM

## 2017-06-07 LAB — CBC WITH DIFFERENTIAL/PLATELET
BASOS ABS: 0.1 10*3/uL (ref 0.0–0.1)
Basophils Relative: 0.8 % (ref 0.0–3.0)
Eosinophils Absolute: 0.4 10*3/uL (ref 0.0–0.7)
Eosinophils Relative: 6 % — ABNORMAL HIGH (ref 0.0–5.0)
HCT: 41.9 % (ref 36.0–46.0)
HEMOGLOBIN: 14.2 g/dL (ref 12.0–15.0)
LYMPHS ABS: 1.3 10*3/uL (ref 0.7–4.0)
Lymphocytes Relative: 18.2 % (ref 12.0–46.0)
MCHC: 34 g/dL (ref 30.0–36.0)
MCV: 95.9 fl (ref 78.0–100.0)
MONOS PCT: 8.1 % (ref 3.0–12.0)
Monocytes Absolute: 0.6 10*3/uL (ref 0.1–1.0)
NEUTROS PCT: 66.9 % (ref 43.0–77.0)
Neutro Abs: 4.7 10*3/uL (ref 1.4–7.7)
Platelets: 340 10*3/uL (ref 150.0–400.0)
RBC: 4.37 Mil/uL (ref 3.87–5.11)
RDW: 13.1 % (ref 11.5–15.5)
WBC: 7.1 10*3/uL (ref 4.0–10.5)

## 2017-06-07 LAB — COMPREHENSIVE METABOLIC PANEL
ALBUMIN: 4.6 g/dL (ref 3.5–5.2)
ALK PHOS: 56 U/L (ref 39–117)
ALT: 33 U/L (ref 0–35)
AST: 22 U/L (ref 0–37)
BILIRUBIN TOTAL: 0.7 mg/dL (ref 0.2–1.2)
BUN: 9 mg/dL (ref 6–23)
CO2: 27 mEq/L (ref 19–32)
Calcium: 10 mg/dL (ref 8.4–10.5)
Chloride: 102 mEq/L (ref 96–112)
Creatinine, Ser: 0.73 mg/dL (ref 0.40–1.20)
GFR: 86.36 mL/min (ref >60.00)
GLUCOSE: 114 mg/dL — AB (ref 70–99)
Potassium: 4.1 mEq/L (ref 3.5–5.1)
SODIUM: 139 meq/L (ref 135–145)
TOTAL PROTEIN: 7.1 g/dL (ref 6.0–8.3)

## 2017-06-07 LAB — LIPID PANEL
Cholesterol: 247 mg/dL — ABNORMAL HIGH (ref 0–200)
HDL: 79.6 mg/dL (ref >39.00)
LDL Cholesterol: 149 mg/dL — ABNORMAL HIGH (ref 0–99)
NONHDL: 167.85
Total CHOL/HDL Ratio: 3
Triglycerides: 94 mg/dL (ref 0.0–149.0)
VLDL: 18.8 mg/dL (ref 0.0–40.0)

## 2017-06-07 LAB — HEPATITIS C ANTIBODY: HCV AB: NEGATIVE

## 2017-06-07 LAB — HEMOGLOBIN A1C: HEMOGLOBIN A1C: 5.9 % (ref 4.6–6.5)

## 2017-06-07 LAB — TSH: TSH: 2.23 u[IU]/mL (ref 0.35–4.50)

## 2017-06-07 NOTE — Patient Instructions (Signed)
Please go to the lab for blood work.   Our office will call you with your results unless you have chosen to receive results via MyChart.  If your blood work is normal we will follow-up each year for physicals and as scheduled for chronic medical problems.  If anything is abnormal we will treat accordingly and get you in for a follow-up.  Please continue medications as directed. Follow-up will be based on results.   Preventive Care 40-64 Years, Female Preventive care refers to lifestyle choices and visits with your health care provider that can promote health and wellness. What does preventive care include?  A yearly physical exam. This is also called an annual well check.  Dental exams once or twice a year.  Routine eye exams. Ask your health care provider how often you should have your eyes checked.  Personal lifestyle choices, including: ? Daily care of your teeth and gums. ? Regular physical activity. ? Eating a healthy diet. ? Avoiding tobacco and drug use. ? Limiting alcohol use. ? Practicing safe sex. ? Taking low-dose aspirin daily starting at age 66. ? Taking vitamin and mineral supplements as recommended by your health care provider. What happens during an annual well check? The services and screenings done by your health care provider during your annual well check will depend on your age, overall health, lifestyle risk factors, and family history of disease. Counseling Your health care provider may ask you questions about your:  Alcohol use.  Tobacco use.  Drug use.  Emotional well-being.  Home and relationship well-being.  Sexual activity.  Eating habits.  Work and work Statistician.  Method of birth control.  Menstrual cycle.  Pregnancy history.  Screening You may have the following tests or measurements:  Height, weight, and BMI.  Blood pressure.  Lipid and cholesterol levels. These may be checked every 5 years, or more frequently if you  are over 26 years old.  Skin check.  Lung cancer screening. You may have this screening every year starting at age 26 if you have a 30-pack-year history of smoking and currently smoke or have quit within the past 15 years.  Fecal occult blood test (FOBT) of the stool. You may have this test every year starting at age 63.  Flexible sigmoidoscopy or colonoscopy. You may have a sigmoidoscopy every 5 years or a colonoscopy every 10 years starting at age 74.  Hepatitis C blood test.  Hepatitis B blood test.  Sexually transmitted disease (STD) testing.  Diabetes screening. This is done by checking your blood sugar (glucose) after you have not eaten for a while (fasting). You may have this done every 1-3 years.  Mammogram. This may be done every 1-2 years. Talk to your health care provider about when you should start having regular mammograms. This may depend on whether you have a family history of breast cancer.  BRCA-related cancer screening. This may be done if you have a family history of breast, ovarian, tubal, or peritoneal cancers.  Pelvic exam and Pap test. This may be done every 3 years starting at age 47. Starting at age 6, this may be done every 5 years if you have a Pap test in combination with an HPV test.  Bone density scan. This is done to screen for osteoporosis. You may have this scan if you are at high risk for osteoporosis.  Discuss your test results, treatment options, and if necessary, the need for more tests with your health care provider. Vaccines Your health  care provider may recommend certain vaccines, such as:  Influenza vaccine. This is recommended every year.  Tetanus, diphtheria, and acellular pertussis (Tdap, Td) vaccine. You may need a Td booster every 10 years.  Varicella vaccine. You may need this if you have not been vaccinated.  Zoster vaccine. You may need this after age 57.  Measles, mumps, and rubella (MMR) vaccine. You may need at least one dose  of MMR if you were born in 1957 or later. You may also need a second dose.  Pneumococcal 13-valent conjugate (PCV13) vaccine. You may need this if you have certain conditions and were not previously vaccinated.  Pneumococcal polysaccharide (PPSV23) vaccine. You may need one or two doses if you smoke cigarettes or if you have certain conditions.  Meningococcal vaccine. You may need this if you have certain conditions.  Hepatitis A vaccine. You may need this if you have certain conditions or if you travel or work in places where you may be exposed to hepatitis A.  Hepatitis B vaccine. You may need this if you have certain conditions or if you travel or work in places where you may be exposed to hepatitis B.  Haemophilus influenzae type b (Hib) vaccine. You may need this if you have certain conditions.  Talk to your health care provider about which screenings and vaccines you need and how often you need them. This information is not intended to replace advice given to you by your health care provider. Make sure you discuss any questions you have with your health care provider. Document Released: 12/26/2015 Document Revised: 08/18/2016 Document Reviewed: 09/30/2015 Elsevier Interactive Patient Education  2017 [employer].

## 2017-06-07 NOTE — Assessment & Plan Note (Signed)
Taking medications as directed. Repeat lipids today.

## 2017-06-07 NOTE — Progress Notes (Signed)
Patient presents to clinic today for annual exam.  Patient is fasting for labs.  Acute Concerns: Denies acute concerns today.  Chronic Issues: Hypertension -- Currently on a regimen of amlodipine and losartan. Is taking daily as directed. BP has been well-controlled on this regimen previously. Patient denies chest pain, palpitations, lightheadedness, dizziness, vision changes or frequent headaches.  BP Readings from Last 3 Encounters:  06/07/17 138/80  01/23/16 120/88  01/21/15 127/90   Hyperlipidemia -- Currently on Atorvastatin 20 mg daily. Is taking as directed. Is trying to stay active and eat a well-balanced diet.   Health Maintenance: Immunizations -- up-to-date. Colonoscopy -- up-to-date.  Mammogram -- up-to-date. Last 11/25/2016.  PAP -- Overdue. 06/27/17 -- Dr. Rogue Bussing.  Past Medical History:  Diagnosis Date  . Allergy    seasonal & animal triggers  . Asthma   . Diverticulitis 2009 & 2011  . Hx of adenomatous colonic polyps 2004 & 2007  . Hyperlipidemia   . Hypertension     Past Surgical History:  Procedure Laterality Date  . ANTERIOR CRUCIATE LIGAMENT REPAIR  1995  . APPENDECTOMY    . BREAST BIOPSY  2005  . colonoscopy with polypectomy     Dr Cristina Gong    Current Outpatient Prescriptions on File Prior to Visit  Medication Sig Dispense Refill  . albuterol (PROVENTIL HFA;VENTOLIN HFA) 108 (90 BASE) MCG/ACT inhaler Inhale 2 puffs into the lungs every 6 (six) hours as needed for wheezing or shortness of breath. 1 Inhaler 2  . amLODipine (NORVASC) 5 MG tablet TAKE 1 BY MOUTH DAILY 90 tablet 3  . atorvastatin (LIPITOR) 20 MG tablet Take 1 tablet (20 mg total) by mouth daily. 90 tablet 3  . losartan (COZAAR) 50 MG tablet TAKE 1 BY MOUTH DAILY 90 tablet 3  . Multiple Vitamins-Minerals (MULTIVITAMIN GUMMIES ADULT PO) Take by mouth daily.     No current facility-administered medications on file prior to visit.     Allergies  Allergen Reactions  . Codeine    Vomitting  . Sulfa Antibiotics Hives  . Lisinopril     cough    Family History  Problem Relation Age of Onset  . Colon cancer Father   . Stroke Father   . Hyperlipidemia Mother   . Breast cancer Mother   . Lung cancer Mother        2 lobectomies ; radiation  . Heart attack Maternal Aunt 46  . Heart attack Paternal Aunt 31       Bullous Pemphigoid; Hamman - Rich Syndrome  . Hyperlipidemia Unknown        5 M aunts  . Thyroid disease Unknown        90 M aunts  . Hypertension Unknown        multiple M aunts   . Breast cancer Unknown        77 M aunts  . GER disease Son   . Stroke Maternal Grandmother   . Diabetes Neg Hx     Social History   Social History  . Marital status: Single    Spouse name: N/A  . Number of children: N/A  . Years of education: N/A   Occupational History  . Not on file.   Social History Main Topics  . Smoking status: Never Smoker  . Smokeless tobacco: Never Used  . Alcohol use 3.0 oz/week    5 Glasses of wine per week     Comment:  socially  . Drug use: No  .  Sexual activity: Not Currently   Other Topics Concern  . Not on file   Social History Narrative  . No narrative on file   Review of Systems  Constitutional: Negative for fever and weight loss.  HENT: Negative for ear discharge, ear pain, hearing loss and tinnitus.   Eyes: Negative for blurred vision, double vision, photophobia and pain.  Respiratory: Negative for cough and shortness of breath.   Cardiovascular: Negative for chest pain and palpitations.  Gastrointestinal: Negative for abdominal pain, blood in stool, constipation, diarrhea, heartburn, melena, nausea and vomiting.  Genitourinary: Negative for dysuria, flank pain, frequency, hematuria and urgency.  Musculoskeletal: Negative for falls.  Neurological: Negative for dizziness, loss of consciousness and headaches.  Endo/Heme/Allergies: Negative for environmental allergies.  Psychiatric/Behavioral: Negative for depression,  hallucinations, substance abuse and suicidal ideas. The patient is not nervous/anxious and does not have insomnia.    BP 138/80   Pulse 77   Temp 98.8 F (37.1 C) (Oral)   Resp 14   Ht 5\' 7"  (1.702 m)   Wt 203 lb (92.1 kg)   SpO2 98%   BMI 31.79 kg/m   Physical Exam  Constitutional: She is oriented to person, place, and time and well-developed, well-nourished, and in no distress.  HENT:  Head: Normocephalic and atraumatic.  Right Ear: Tympanic membrane, external ear and ear canal normal.  Left Ear: Tympanic membrane, external ear and ear canal normal.  Nose: Nose normal. No mucosal edema.  Mouth/Throat: Uvula is midline, oropharynx is clear and moist and mucous membranes are normal. No oropharyngeal exudate or posterior oropharyngeal erythema.  Eyes: Conjunctivae are normal. Pupils are equal, round, and reactive to light.  Neck: Neck supple. No thyromegaly present.  Cardiovascular: Normal rate, regular rhythm, normal heart sounds and intact distal pulses.   Pulmonary/Chest: Effort normal and breath sounds normal. No respiratory distress. She has no wheezes. She has no rales. She exhibits no tenderness.  Abdominal: Soft. Bowel sounds are normal. She exhibits no distension and no mass. There is no tenderness. There is no rebound and no guarding.  Lymphadenopathy:    She has no cervical adenopathy.  Neurological: She is alert and oriented to person, place, and time. No cranial nerve deficit.  Skin: Skin is warm and dry. No rash noted.  Psychiatric: Affect normal.  Vitals reviewed.  Assessment/Plan: Visit for preventive health examination Depression screen negative. Health Maintenance reviewed -- Has repeat colonoscopy scheduled. Follow-up with GYN scheduled for PAP smear. Preventive schedule discussed and handout given in AVS. Will obtain fasting labs today.   Need for hepatitis C screening test Hep C antibody.   Hyperlipidemia Taking medications as directed. Repeat lipids  today.  Essential hypertension BP stable. Asymptomatic. Continue current regimen.    Leeanne Rio, PA-C

## 2017-06-07 NOTE — Assessment & Plan Note (Signed)
BP stable. Asymptomatic. Continue current regimen. 

## 2017-06-07 NOTE — Assessment & Plan Note (Signed)
Depression screen negative. Health Maintenance reviewed -- Has repeat colonoscopy scheduled. Follow-up with GYN scheduled for PAP smear. Preventive schedule discussed and handout given in AVS. Will obtain fasting labs today.

## 2017-06-07 NOTE — Progress Notes (Signed)
Pre visit review using our clinic review tool, if applicable. No additional management support is needed unless otherwise documented below in the visit note. 

## 2017-06-07 NOTE — Assessment & Plan Note (Signed)
Hep C antibody.

## 2017-06-08 ENCOUNTER — Telehealth: Payer: Self-pay | Admitting: Physician Assistant

## 2017-06-08 DIAGNOSIS — I10 Essential (primary) hypertension: Secondary | ICD-10-CM

## 2017-06-08 MED ORDER — LOSARTAN POTASSIUM 50 MG PO TABS: ORAL_TABLET | ORAL | 2 refills | Status: DC

## 2017-06-08 MED ORDER — ATORVASTATIN CALCIUM 20 MG PO TABS: 20.0000 mg | ORAL_TABLET | Freq: Every day | ORAL | 2 refills | Status: DC

## 2017-06-08 MED ORDER — AMLODIPINE BESYLATE 5 MG PO TABS: ORAL_TABLET | ORAL | 2 refills | Status: DC

## 2017-06-08 NOTE — Telephone Encounter (Signed)
Pt LM to states that the pharmacy [place] - [facility], phone # [phone]. Pt states that she needs all 3 Rx called in

## 2017-06-08 NOTE — Telephone Encounter (Signed)
Sent refill of the Losartan, Amlodipine and Atorvastatin to [facility]

## 2017-06-27 DIAGNOSIS — Z6831 Body mass index (BMI) 31.0-31.9, adult: Secondary | ICD-10-CM | POA: Diagnosis not present

## 2017-06-27 DIAGNOSIS — Z01411 Encounter for gynecological examination (general) (routine) with abnormal findings: Secondary | ICD-10-CM | POA: Diagnosis not present

## 2017-08-08 DIAGNOSIS — Z1211 Encounter for screening for malignant neoplasm of colon: Secondary | ICD-10-CM | POA: Diagnosis not present

## 2017-08-08 DIAGNOSIS — Z800 Family history of malignant neoplasm of digestive organs: Secondary | ICD-10-CM | POA: Diagnosis not present

## 2017-08-08 LAB — HM COLONOSCOPY

## 2017-08-22 ENCOUNTER — Encounter: Payer: Self-pay | Admitting: Emergency Medicine

## 2017-08-26 ENCOUNTER — Encounter: Payer: Self-pay | Admitting: Physician Assistant

## 2017-08-26 ENCOUNTER — Ambulatory Visit (INDEPENDENT_AMBULATORY_CARE_PROVIDER_SITE_OTHER): Payer: BLUE CROSS/BLUE SHIELD | Admitting: Physician Assistant

## 2017-08-26 VITALS — BP 120/70 | HR 82 | Temp 97.8°F | Resp 14 | Ht 68.0 in | Wt 203.0 lb

## 2017-08-26 DIAGNOSIS — J45901 Unspecified asthma with (acute) exacerbation: Secondary | ICD-10-CM | POA: Diagnosis not present

## 2017-08-26 DIAGNOSIS — J209 Acute bronchitis, unspecified: Secondary | ICD-10-CM

## 2017-08-26 MED ORDER — PREDNISONE 20 MG PO TABS: 40.0000 mg | ORAL_TABLET | Freq: Every day | ORAL | 0 refills | Status: DC

## 2017-08-26 MED ORDER — AZITHROMYCIN 250 MG PO TABS: ORAL_TABLET | ORAL | 0 refills | Status: DC

## 2017-08-26 NOTE — Patient Instructions (Signed)
Take antibiotic (Azithromycin) as directed.  Increase fluids.  Get plenty of rest. Use Mucinex for congestion. Start Flonase as directed. Also start the prednisone burst and take as directed. Take a daily probiotic (I recommend Align or Culturelle, but even Activia Yogurt may be beneficial).  A humidifier placed in the bedroom may offer some relief for a dry, scratchy throat of nasal irritation.  Read information below on acute bronchitis. Please call or return to clinic if symptoms are not improving.  Acute Bronchitis Bronchitis is when the airways that extend from the windpipe into the lungs get red, puffy, and painful (inflamed). Bronchitis often causes thick spit (mucus) to develop. This leads to a cough. A cough is the most common symptom of bronchitis. In acute bronchitis, the condition usually begins suddenly and goes away over time (usually in 2 weeks). Smoking, allergies, and asthma can make bronchitis worse. Repeated episodes of bronchitis may cause more lung problems.  HOME CARE  Rest.  Drink enough fluids to keep your pee (urine) clear or pale yellow (unless you need to limit fluids as told by your doctor).  Only take over-the-counter or prescription medicines as told by your doctor.  Avoid smoking and secondhand smoke. These can make bronchitis worse. If you are a smoker, think about using nicotine gum or skin patches. Quitting smoking will help your lungs heal faster.  Reduce the chance of getting bronchitis again by:  Washing your hands often.  Avoiding people with cold symptoms.  Trying not to touch your hands to your mouth, nose, or eyes.  Follow up with your doctor as told.  GET HELP IF: Your symptoms do not improve after 1 week of treatment. Symptoms include:  Cough.  Fever.  Coughing up thick spit.  Body aches.  Chest congestion.  Chills.  Shortness of breath.  Sore throat.  GET HELP RIGHT AWAY IF:   You have an increased fever.  You have  chills.  You have severe shortness of breath.  You have bloody thick spit (sputum).  You throw up (vomit) often.  You lose too much body fluid (dehydration).  You have a severe headache.  You faint.  MAKE SURE YOU:   Understand these instructions.  Will watch your condition.  Will get help right away if you are not doing well or get worse. Document Released: 05/17/2008 Document Revised: 08/01/2013 Document Reviewed: 05/22/2013 [facility] Patient Information 2015 [place], [place]. This information is not intended to replace advice given to you by your health care provider. Make sure you discuss any questions you have with your health care provider.

## 2017-08-26 NOTE — Progress Notes (Signed)
Patient presents to clinic today c/o over 1 week of runny nose, sore throat now with chest congestion and productive cough. Has history of asthma and notes a flare up of asthma. Is using Albuterol neb at home with some improvement. Denies recent travel.   Past Medical History:  Diagnosis Date  . Allergy    seasonal & animal triggers  . Asthma   . Diverticulitis 2009 & 2011  . Hx of adenomatous colonic polyps 2004 & 2007  . Hyperlipidemia   . Hypertension     Current Outpatient Prescriptions on File Prior to Visit  Medication Sig Dispense Refill  . albuterol (PROVENTIL HFA;VENTOLIN HFA) 108 (90 BASE) MCG/ACT inhaler Inhale 2 puffs into the lungs every 6 (six) hours as needed for wheezing or shortness of breath. 1 Inhaler 2  . amLODipine (NORVASC) 5 MG tablet TAKE 1 BY MOUTH DAILY 90 tablet 2  . atorvastatin (LIPITOR) 20 MG tablet Take 1 tablet (20 mg total) by mouth daily. 90 tablet 2  . cholecalciferol (VITAMIN D) 1000 units tablet Take 1,000 Units by mouth daily.    Marland Kitchen loratadine (CLARITIN) 10 MG tablet Take 10 mg by mouth daily.    Marland Kitchen losartan (COZAAR) 50 MG tablet TAKE 1 BY MOUTH DAILY 90 tablet 2  . Multiple Vitamins-Minerals (MULTIVITAMIN GUMMIES ADULT PO) Take by mouth daily.     No current facility-administered medications on file prior to visit.     Allergies  Allergen Reactions  . Codeine     Vomitting  . Sulfa Antibiotics Hives  . Lisinopril     cough    Family History  Problem Relation Age of Onset  . Colon cancer Father   . Stroke Father   . Hyperlipidemia Mother   . Breast cancer Mother   . Lung cancer Mother        2 lobectomies ; radiation  . Heart attack Maternal Aunt 46  . Heart attack Paternal Aunt 81       Bullous Pemphigoid; Hamman - Rich Syndrome  . Hyperlipidemia Unknown        5 M aunts  . Thyroid disease Unknown        72 M aunts  . Hypertension Unknown        multiple M aunts   . Breast cancer Unknown        10 M aunts  . GER disease Son    . Stroke Maternal Grandmother   . Diabetes Neg Hx     Social History   Social History  . Marital status: Single    Spouse name: N/A  . Number of children: N/A  . Years of education: N/A   Social History Main Topics  . Smoking status: Never Smoker  . Smokeless tobacco: Never Used  . Alcohol use 3.0 oz/week    5 Glasses of wine per week     Comment:  socially  . Drug use: No  . Sexual activity: Not Currently   Other Topics Concern  . None   Social History Narrative  . None    Review of Systems - See HPI.  All other ROS are negative.  BP 120/70   Pulse 82   Temp 97.8 F (36.6 C) (Oral)   Resp 14   Ht 5\' 8"  (1.727 m)   Wt 203 lb (92.1 kg)   SpO2 98%   BMI 30.87 kg/m   Physical Exam  Constitutional: She is oriented to person, place, and time and well-developed, well-nourished, and  in no distress.  HENT:  Head: Normocephalic and atraumatic.  Right Ear: External ear normal.  Left Ear: External ear normal.  Nose: Nose normal.  Mouth/Throat: Oropharynx is clear and moist. No oropharyngeal exudate.  Eyes: Conjunctivae are normal.  Neck: Neck supple.  Cardiovascular: Normal rate, regular rhythm, normal heart sounds and intact distal pulses.   Pulmonary/Chest: No respiratory distress. She has wheezes. She has no rales. She exhibits no tenderness.  Lymphadenopathy:    She has no cervical adenopathy.  Neurological: She is alert and oriented to person, place, and time.  Skin: Skin is warm and dry. No rash noted.  Psychiatric: Affect normal.  Vitals reviewed.   Recent Results (from the past 2160 hour(s))  CBC with Differential/Platelet     Status: Abnormal   Collection Time: 06/07/17  3:21 PM  Result Value Ref Range   WBC 7.1 4.0 - 10.5 K/uL   RBC 4.37 3.87 - 5.11 Mil/uL   Hemoglobin 14.2 12.0 - 15.0 g/dL   HCT 41.9 36.0 - 46.0 %   MCV 95.9 78.0 - 100.0 fl   MCHC 34.0 30.0 - 36.0 g/dL   RDW 13.1 11.5 - 15.5 %   Platelets 340.0 150.0 - 400.0 K/uL    Neutrophils Relative % 66.9 43.0 - 77.0 %   Lymphocytes Relative 18.2 12.0 - 46.0 %   Monocytes Relative 8.1 3.0 - 12.0 %   Eosinophils Relative 6.0 (H) 0.0 - 5.0 %   Basophils Relative 0.8 0.0 - 3.0 %   Neutro Abs 4.7 1.4 - 7.7 K/uL   Lymphs Abs 1.3 0.7 - 4.0 K/uL   Monocytes Absolute 0.6 0.1 - 1.0 K/uL   Eosinophils Absolute 0.4 0.0 - 0.7 K/uL   Basophils Absolute 0.1 0.0 - 0.1 K/uL  Comprehensive metabolic panel     Status: Abnormal   Collection Time: 06/07/17  3:21 PM  Result Value Ref Range   Sodium 139 135 - 145 mEq/L   Potassium 4.1 3.5 - 5.1 mEq/L   Chloride 102 96 - 112 mEq/L   CO2 27 19 - 32 mEq/L   Glucose, Bld 114 (H) 70 - 99 mg/dL   BUN 9 6 - 23 mg/dL   Creatinine, Ser 0.73 0.40 - 1.20 mg/dL   Total Bilirubin 0.7 0.2 - 1.2 mg/dL   Alkaline Phosphatase 56 39 - 117 U/L   AST 22 0 - 37 U/L   ALT 33 0 - 35 U/L   Total Protein 7.1 6.0 - 8.3 g/dL   Albumin 4.6 3.5 - 5.2 g/dL   Calcium 10.0 8.4 - 10.5 mg/dL   GFR 86.36 >60.00 mL/min  Lipid panel     Status: Abnormal   Collection Time: 06/07/17  3:21 PM  Result Value Ref Range   Cholesterol 247 (H) 0 - 200 mg/dL    Comment: ATP III Classification       Desirable:  < 200 mg/dL               Borderline High:  200 - 239 mg/dL          High:  > = 240 mg/dL   Triglycerides 94.0 0.0 - 149.0 mg/dL    Comment: Normal:  <150 mg/dLBorderline High:  150 - 199 mg/dL   HDL 79.60 >39.00 mg/dL   VLDL 18.8 0.0 - 40.0 mg/dL   LDL Cholesterol 149 (H) 0 - 99 mg/dL   Total CHOL/HDL Ratio 3     Comment:  Men          Women1/2 Average Risk     3.4          3.3Average Risk          5.0          4.42X Average Risk          9.6          7.13X Average Risk          15.0          11.0                       NonHDL 167.85     Comment: NOTE:  Non-HDL goal should be 30 mg/dL higher than patient's LDL goal (i.e. LDL goal of < 70 mg/dL, would have non-HDL goal of < 100 mg/dL)  Hemoglobin A1c     Status: None   Collection Time: 06/07/17   3:21 PM  Result Value Ref Range   Hgb A1c MFr Bld 5.9 4.6 - 6.5 %    Comment: Glycemic Control Guidelines for People with Diabetes:Non Diabetic:  <6%Goal of Therapy: <7%Additional Action Suggested:  >8%   TSH     Status: None   Collection Time: 06/07/17  3:21 PM  Result Value Ref Range   TSH 2.23 0.35 - 4.50 uIU/mL  Hepatitis C Antibody     Status: None   Collection Time: 06/07/17  3:21 PM  Result Value Ref Range   HCV Ab NEGATIVE NEGATIVE  HM COLONOSCOPY     Status: None   Collection Time: 08/08/17 12:00 AM  Result Value Ref Range   HM Colonoscopy See Report (in chart) See Report (in chart), Patient Reported    Assessment/Plan: 1. Acute bronchitis with asthma with acute exacerbation Start Azithromycin and 5-day prednisone burst. Continue supportive measures. Continue albuterol. Restart Flonase. Return precautions reviewed with patient.  - azithromycin (ZITHROMAX) 250 MG tablet; Take 2 tablets on Day 1. Then take 1 tablet daily.  Dispense: 6 tablet; Refill: 0 - predniSONE (DELTASONE) 20 MG tablet; Take 2 tablets (40 mg total) by mouth daily with breakfast.  Dispense: 10 tablet; Refill: 0   Leeanne Rio, [place]

## 2017-08-26 NOTE — Progress Notes (Signed)
Pre visit review using our clinic review tool, if applicable. No additional management support is needed unless otherwise documented below in the visit note. 

## 2017-09-30 ENCOUNTER — Encounter: Payer: Self-pay | Admitting: Physician Assistant

## 2017-10-10 DIAGNOSIS — L821 Other seborrheic keratosis: Secondary | ICD-10-CM | POA: Diagnosis not present

## 2017-10-10 DIAGNOSIS — L820 Inflamed seborrheic keratosis: Secondary | ICD-10-CM | POA: Diagnosis not present

## 2017-10-10 DIAGNOSIS — D225 Melanocytic nevi of trunk: Secondary | ICD-10-CM | POA: Diagnosis not present

## 2017-10-10 DIAGNOSIS — D1801 Hemangioma of skin and subcutaneous tissue: Secondary | ICD-10-CM | POA: Diagnosis not present

## 2017-10-10 DIAGNOSIS — L812 Freckles: Secondary | ICD-10-CM | POA: Diagnosis not present

## 2017-10-14 ENCOUNTER — Other Ambulatory Visit: Payer: Self-pay | Admitting: Obstetrics and Gynecology

## 2017-10-14 DIAGNOSIS — Z139 Encounter for screening, unspecified: Secondary | ICD-10-CM

## 2017-10-24 ENCOUNTER — Ambulatory Visit: Payer: BLUE CROSS/BLUE SHIELD | Admitting: Physician Assistant

## 2017-10-24 ENCOUNTER — Encounter: Payer: Self-pay | Admitting: Physician Assistant

## 2017-10-24 ENCOUNTER — Other Ambulatory Visit: Payer: Self-pay

## 2017-10-24 VITALS — BP 124/70 | HR 100 | Temp 98.4°F | Resp 14 | Ht 68.0 in | Wt 207.0 lb

## 2017-10-24 DIAGNOSIS — J4531 Mild persistent asthma with (acute) exacerbation: Secondary | ICD-10-CM

## 2017-10-24 DIAGNOSIS — L918 Other hypertrophic disorders of the skin: Secondary | ICD-10-CM | POA: Diagnosis not present

## 2017-10-24 MED ORDER — BUDESONIDE-FORMOTEROL FUMARATE 80-4.5 MCG/ACT IN AERO: 2.0000 | INHALATION_SPRAY | Freq: Two times a day (BID) | RESPIRATORY_TRACT | 3 refills | Status: DC

## 2017-10-24 MED ORDER — PREDNISONE 20 MG PO TABS: 40.0000 mg | ORAL_TABLET | Freq: Every day | ORAL | 0 refills | Status: DC

## 2017-10-24 NOTE — Progress Notes (Signed)
Pre visit review using our clinic review tool, if applicable. No additional management support is needed unless otherwise documented below in the visit note. 

## 2017-10-24 NOTE — Patient Instructions (Signed)
Please stay well-hydrated and get plenty of rest.  Start Mucinex-DM for congestion. Take the steroid burst as directed and start Symbicort.  Follow-up if symptoms are not resolving or if new symptoms develop.

## 2017-10-24 NOTE — Progress Notes (Signed)
Patient presents to clinic today c/o 2 weeks of wheezing and 1 day of a cough that is sometimes productive of clear sputum . Notes ear pressure and sinus pressure with nasal congestion. Denies fever, chills, sinus pain, ear pain, sore throat. Notes some fatigue. Has been travelling a lot locally and has had increased stress.  Past Medical History:  Diagnosis Date  . Allergy    seasonal & animal triggers  . Asthma   . Diverticulitis 2009 & 2011  . Hx of adenomatous colonic polyps 2004 & 2007  . Hyperlipidemia   . Hypertension     Current Outpatient Medications on File Prior to Visit  Medication Sig Dispense Refill  . albuterol (PROVENTIL HFA;VENTOLIN HFA) 108 (90 BASE) MCG/ACT inhaler Inhale 2 puffs into the lungs every 6 (six) hours as needed for wheezing or shortness of breath. 1 Inhaler 2  . amLODipine (NORVASC) 5 MG tablet TAKE 1 BY MOUTH DAILY 90 tablet 2  . atorvastatin (LIPITOR) 20 MG tablet Take 1 tablet (20 mg total) by mouth daily. 90 tablet 2  . cholecalciferol (VITAMIN D) 1000 units tablet Take 1,000 Units by mouth daily.    Marland Kitchen loratadine (CLARITIN) 10 MG tablet Take 10 mg by mouth daily.    Marland Kitchen losartan (COZAAR) 50 MG tablet TAKE 1 BY MOUTH DAILY 90 tablet 2  . Multiple Vitamins-Minerals (MULTIVITAMIN GUMMIES ADULT PO) Take by mouth daily.     No current facility-administered medications on file prior to visit.     Allergies  Allergen Reactions  . Codeine     Vomitting  . Sulfa Antibiotics Hives  . Lisinopril     cough    Family History  Problem Relation Age of Onset  . Colon cancer Father   . Stroke Father   . Hyperlipidemia Mother   . Breast cancer Mother   . Lung cancer Mother        2 lobectomies ; radiation  . Heart attack Maternal Aunt 46  . Heart attack Paternal Aunt 45       Bullous Pemphigoid; Hamman - Rich Syndrome  . Hyperlipidemia Unknown        5 M aunts  . Thyroid disease Unknown        21 M aunts  . Hypertension Unknown        multiple M  aunts   . Breast cancer Unknown        100 M aunts  . GER disease Son   . Stroke Maternal Grandmother   . Diabetes Neg Hx     Social History   Socioeconomic History  . Marital status: Single    Spouse name: None  . Number of children: None  . Years of education: None  . Highest education level: None  Social Needs  . Financial resource strain: None  . Food insecurity - worry: None  . Food insecurity - inability: None  . Transportation needs - medical: None  . Transportation needs - non-medical: None  Occupational History  . None  Tobacco Use  . Smoking status: Never Smoker  . Smokeless tobacco: Never Used  Substance and Sexual Activity  . Alcohol use: Yes    Alcohol/week: 3.0 oz    Types: 5 Glasses of wine per week    Comment:  socially  . Drug use: No  . Sexual activity: Not Currently  Other Topics Concern  . None  Social History Narrative  . None   Review of Systems - See HPI.  All  other ROS are negative.  BP 124/70   Pulse 100   Temp 98.4 F (36.9 C) (Oral)   Resp 14   Ht 5\' 8"  (1.727 m)   Wt 207 lb (93.9 kg)   SpO2 99%   BMI 31.47 kg/m   Physical Exam  Constitutional: She is oriented to person, place, and time and well-developed, well-nourished, and in no distress.  HENT:  Head: Normocephalic and atraumatic.  Eyes: Conjunctivae are normal.  Neck: Neck supple.  Cardiovascular: Normal rate, regular rhythm, normal heart sounds and intact distal pulses.  Pulmonary/Chest: Effort normal. No respiratory distress. She has wheezes. She has no rales. She exhibits no tenderness.  Neurological: She is alert and oriented to person, place, and time.  Skin: Skin is warm and dry. No rash noted.  Psychiatric: Affect normal.  Vitals reviewed.  Recent Results (from the past 2160 hour(s))  HM COLONOSCOPY     Status: None   Collection Time: 08/08/17 12:00 AM  Result Value Ref Range   HM Colonoscopy See Report (in chart) See Report (in chart), Patient Reported     Assessment/Plan: 1. Mild persistent asthma with exacerbation Duoneb given with improvement of symptoms. Start prednisone burst and symbicort. Restart Mucinex-DM. Supportive measures reviewed. Follow-up if not improving.   2. Skin tag Right side of neck. Cryotherapy x 1. Supportive measures discussed.    Leeanne Rio, PA-C

## 2017-11-28 ENCOUNTER — Ambulatory Visit
Admission: RE | Admit: 2017-11-28 | Discharge: 2017-11-28 | Disposition: A | Discharge status: Home / Self Care | Payer: BLUE CROSS/BLUE SHIELD | Source: Ambulatory Visit | Attending: Obstetrics and Gynecology | Admitting: Obstetrics and Gynecology

## 2017-11-28 DIAGNOSIS — Z1231 Encounter for screening mammogram for malignant neoplasm of breast: Secondary | ICD-10-CM | POA: Diagnosis not present

## 2017-11-28 DIAGNOSIS — Z139 Encounter for screening, unspecified: Secondary | ICD-10-CM

## 2018-02-02 ENCOUNTER — Encounter: Payer: Self-pay | Admitting: Emergency Medicine

## 2018-02-02 ENCOUNTER — Other Ambulatory Visit: Payer: Self-pay | Admitting: Physician Assistant

## 2018-02-02 DIAGNOSIS — I10 Essential (primary) hypertension: Secondary | ICD-10-CM

## 2018-06-30 DIAGNOSIS — Z6832 Body mass index (BMI) 32.0-32.9, adult: Secondary | ICD-10-CM | POA: Diagnosis not present

## 2018-06-30 DIAGNOSIS — Z124 Encounter for screening for malignant neoplasm of cervix: Secondary | ICD-10-CM | POA: Diagnosis not present

## 2018-06-30 DIAGNOSIS — Z01411 Encounter for gynecological examination (general) (routine) with abnormal findings: Secondary | ICD-10-CM | POA: Diagnosis not present

## 2018-06-30 DIAGNOSIS — Z803 Family history of malignant neoplasm of breast: Secondary | ICD-10-CM | POA: Diagnosis not present

## 2018-06-30 DIAGNOSIS — Z801 Family history of malignant neoplasm of trachea, bronchus and lung: Secondary | ICD-10-CM | POA: Diagnosis not present

## 2018-06-30 DIAGNOSIS — Z800 Family history of malignant neoplasm of digestive organs: Secondary | ICD-10-CM | POA: Diagnosis not present

## 2018-06-30 DIAGNOSIS — Z86010 Personal history of colonic polyps: Secondary | ICD-10-CM | POA: Diagnosis not present

## 2018-07-26 ENCOUNTER — Encounter: Payer: Self-pay | Admitting: Physician Assistant

## 2018-07-26 ENCOUNTER — Other Ambulatory Visit: Payer: Self-pay

## 2018-07-26 ENCOUNTER — Ambulatory Visit (INDEPENDENT_AMBULATORY_CARE_PROVIDER_SITE_OTHER): Payer: BLUE CROSS/BLUE SHIELD | Admitting: Physician Assistant

## 2018-07-26 VITALS — BP 132/88 | HR 97 | Temp 98.3°F | Resp 17 | Ht 66.0 in | Wt 206.2 lb

## 2018-07-26 DIAGNOSIS — E785 Hyperlipidemia, unspecified: Secondary | ICD-10-CM

## 2018-07-26 DIAGNOSIS — Z0000 Encounter for general adult medical examination without abnormal findings: Secondary | ICD-10-CM

## 2018-07-26 DIAGNOSIS — I10 Essential (primary) hypertension: Secondary | ICD-10-CM | POA: Diagnosis not present

## 2018-07-26 LAB — COMPREHENSIVE METABOLIC PANEL
ALBUMIN: 4.5 g/dL (ref 3.5–5.2)
ALK PHOS: 63 U/L (ref 39–117)
ALT: 38 U/L — ABNORMAL HIGH (ref 0–35)
AST: 22 U/L (ref 0–37)
BUN: 12 mg/dL (ref 6–23)
CALCIUM: 10.6 mg/dL — AB (ref 8.4–10.5)
CHLORIDE: 99 meq/L (ref 96–112)
CO2: 28 mEq/L (ref 19–32)
CREATININE: 0.84 mg/dL (ref 0.40–1.20)
GFR: 73.17 mL/min (ref >60.00)
Glucose, Bld: 121 mg/dL — ABNORMAL HIGH (ref 70–99)
POTASSIUM: 4.3 meq/L (ref 3.5–5.1)
Sodium: 138 mEq/L (ref 135–145)
TOTAL PROTEIN: 7.6 g/dL (ref 6.0–8.3)
Total Bilirubin: 1 mg/dL (ref 0.2–1.2)

## 2018-07-26 LAB — CBC WITH DIFFERENTIAL/PLATELET
BASOS PCT: 0.8 % (ref 0.0–3.0)
Basophils Absolute: 0 10*3/uL (ref 0.0–0.1)
EOS PCT: 5 % (ref 0.0–5.0)
Eosinophils Absolute: 0.3 10*3/uL (ref 0.0–0.7)
HEMATOCRIT: 42.3 % (ref 36.0–46.0)
HEMOGLOBIN: 14.6 g/dL (ref 12.0–15.0)
LYMPHS PCT: 19.2 % (ref 12.0–46.0)
Lymphs Abs: 1.2 10*3/uL (ref 0.7–4.0)
MCHC: 34.5 g/dL (ref 30.0–36.0)
MCV: 95 fl (ref 78.0–100.0)
Monocytes Absolute: 0.6 10*3/uL (ref 0.1–1.0)
Monocytes Relative: 10 % (ref 3.0–12.0)
Neutro Abs: 3.9 10*3/uL (ref 1.4–7.7)
Neutrophils Relative %: 65 % (ref 43.0–77.0)
Platelets: 417 10*3/uL — ABNORMAL HIGH (ref 150.0–400.0)
RBC: 4.45 Mil/uL (ref 3.87–5.11)
RDW: 13.1 % (ref 11.5–15.5)
WBC: 6.1 10*3/uL (ref 4.0–10.5)

## 2018-07-26 LAB — LIPID PANEL
CHOLESTEROL: 247 mg/dL — AB (ref 0–200)
HDL: 77.7 mg/dL (ref >39.00)
LDL CALC: 137 mg/dL — AB (ref 0–99)
NonHDL: 168.96
TRIGLYCERIDES: 159 mg/dL — AB (ref 0.0–149.0)
Total CHOL/HDL Ratio: 3
VLDL: 31.8 mg/dL (ref 0.0–40.0)

## 2018-07-26 LAB — TSH: TSH: 8.06 u[IU]/mL — AB (ref 0.35–4.50)

## 2018-07-26 NOTE — Assessment & Plan Note (Signed)
Taking medications as directed. Dietary and exercise recommendations reviewed with patient. Will repeat fasting lipids today.

## 2018-07-26 NOTE — Assessment & Plan Note (Signed)
Depression screen negative. Health Maintenance reviewed. Preventive schedule discussed and handout given in AVS. Will obtain fasting labs today.  

## 2018-07-26 NOTE — Assessment & Plan Note (Signed)
BP stable. Asymptomatic. Will update labs today. Continue current regimen.

## 2018-07-26 NOTE — Progress Notes (Signed)
Patient presents to clinic today for annual exam.  Patient is fasting for labs.  Acute Concerns: Denies acute concerns today.   Chronic Issues: Hypertension -- Is currently on a regimen of amlodipine 5 mg QD and losartan 50 mg QD. Is taking as directed. Patient denies chest pain, palpitations, lightheadedness, dizziness, vision changes or frequent headaches.  BP Readings from Last 3 Encounters:  07/26/18 132/88  10/24/17 124/70  08/26/17 120/70   Hyperlipidemia -- Is currently on a regimen of Atorvastin 20 mg daily. Is taking as directed. Plays tennis for exercise. Is keeping a well-balanced diet.    Health Maintenance: Immunizations -- up-to-date.  Colonoscopy -- Up-to-date. Recall every 5 years -- 2023 Mammogram -- up-to-date. Due for repeat in 11/2018. + family hx. Has recently had genetic testing. Going in next week for results.  PAP -- 06/30/2018 (normal per patient)  Past Medical History:  Diagnosis Date  . Allergy    seasonal & animal triggers  . Asthma   . Diverticulitis 2009 & 2011  . Hx of adenomatous colonic polyps 2004 & 2007  . Hyperlipidemia   . Hypertension     Past Surgical History:  Procedure Laterality Date  . ANTERIOR CRUCIATE LIGAMENT REPAIR  1995  . APPENDECTOMY    . BREAST BIOPSY  2005  . colonoscopy with polypectomy     Dr Cristina Gong    Current Outpatient Medications on File Prior to Visit  Medication Sig Dispense Refill  . albuterol (PROVENTIL HFA;VENTOLIN HFA) 108 (90 BASE) MCG/ACT inhaler Inhale 2 puffs into the lungs every 6 (six) hours as needed for wheezing or shortness of breath. 1 Inhaler 2  . amLODipine (NORVASC) 5 MG tablet TAKE 1 TABLET BY MOUTH DAILY. GENERIC EQUIVALENT FOR NORVASC 90 tablet 0  . atorvastatin (LIPITOR) 20 MG tablet TAKE 1 TABLET BY MOUTH DAILY 90 tablet 0  . budesonide-formoterol (SYMBICORT) 80-4.5 MCG/ACT inhaler Inhale 2 puffs 2 (two) times daily into the lungs. 1 Inhaler 3  . cholecalciferol (VITAMIN D) 1000 units  tablet Take 1,000 Units by mouth daily.    Marland Kitchen losartan (COZAAR) 50 MG tablet TAKE 1 TABLET BY MOUTH DAILY 90 tablet 0  . Multiple Vitamins-Minerals (MULTIVITAMIN GUMMIES ADULT PO) Take by mouth daily.    . predniSONE (DELTASONE) 20 MG tablet Take 2 tablets (40 mg total) daily with breakfast by mouth. (Patient not taking: Reported on 07/26/2018) 10 tablet 0   No current facility-administered medications on file prior to visit.     Allergies  Allergen Reactions  . Codeine     Vomitting  . Sulfa Antibiotics Hives  . Lisinopril     cough    Family History  Problem Relation Age of Onset  . Colon cancer Father   . Stroke Father   . Hyperlipidemia Mother   . Breast cancer Mother   . Lung cancer Mother        2 lobectomies ; radiation  . Heart attack Maternal Aunt 46  . Heart attack Paternal Aunt 108       Bullous Pemphigoid; Hamman - Rich Syndrome  . Hyperlipidemia Unknown        5 M aunts  . Thyroid disease Unknown        22 M aunts  . Hypertension Unknown        multiple M aunts   . Breast cancer Unknown        25 M aunts  . GER disease Son   . Stroke Maternal Grandmother   .  Diabetes Neg Hx     Social History   Socioeconomic History  . Marital status: Single    Spouse name: Not on file  . Number of children: Not on file  . Years of education: Not on file  . Highest education level: Not on file  Occupational History  . Not on file  Social Needs  . Financial resource strain: Not on file  . Food insecurity:    Worry: Not on file    Inability: Not on file  . Transportation needs:    Medical: Not on file    Non-medical: Not on file  Tobacco Use  . Smoking status: Never Smoker  . Smokeless tobacco: Never Used  Substance and Sexual Activity  . Alcohol use: Yes    Alcohol/week: 5.0 standard drinks    Types: 5 Glasses of wine per week    Comment:  socially  . Drug use: No  . Sexual activity: Not Currently  Lifestyle  . Physical activity:    Days per week: Not on  file    Minutes per session: Not on file  . Stress: Not on file  Relationships  . Social connections:    Talks on phone: Not on file    Gets together: Not on file    Attends religious service: Not on file    Active member of club or organization: Not on file    Attends meetings of clubs or organizations: Not on file    Relationship status: Not on file  . Intimate partner violence:    Fear of current or ex partner: Not on file    Emotionally abused: Not on file    Physically abused: Not on file    Forced sexual activity: Not on file  Other Topics Concern  . Not on file  Social History Narrative  . Not on file   Review of Systems  Constitutional: Negative for fever and weight loss.  HENT: Negative for ear discharge, ear pain, hearing loss and tinnitus.   Eyes: Negative for blurred vision, double vision, photophobia and pain.  Respiratory: Negative for cough and shortness of breath.   Cardiovascular: Negative for chest pain and palpitations.  Gastrointestinal: Negative for abdominal pain, blood in stool, constipation, diarrhea, heartburn, melena, nausea and vomiting.  Genitourinary: Negative for dysuria, flank pain, frequency, hematuria and urgency.  Musculoskeletal: Negative for falls.  Neurological: Negative for dizziness, loss of consciousness and headaches.  Endo/Heme/Allergies: Negative for environmental allergies.  Psychiatric/Behavioral: Negative for depression, hallucinations, substance abuse and suicidal ideas. The patient is not nervous/anxious and does not have insomnia.    BP 132/88   Pulse 97   Temp 98.3 F (36.8 C) (Oral)   Resp 17   Ht 5\' 6"  (1.676 m)   Wt 206 lb 3.2 oz (93.5 kg)   SpO2 95%   BMI 33.28 kg/m   Physical Exam  Constitutional: She is oriented to person, place, and time.  HENT:  Head: Normocephalic and atraumatic.  Right Ear: Tympanic membrane, external ear and ear canal normal.  Left Ear: Tympanic membrane, external ear and ear canal normal.    Nose: Nose normal. No mucosal edema.  Mouth/Throat: Uvula is midline, oropharynx is clear and moist and mucous membranes are normal. No oropharyngeal exudate or posterior oropharyngeal erythema.  Eyes: Pupils are equal, round, and reactive to light. Conjunctivae are normal.  Neck: Neck supple. No thyromegaly present.  Cardiovascular: Normal rate, regular rhythm, normal heart sounds and intact distal pulses.  Pulmonary/Chest: Effort normal  and breath sounds normal. No respiratory distress. She has no wheezes. She has no rales.  Abdominal: Soft. Bowel sounds are normal. She exhibits no distension and no mass. There is no tenderness. There is no rebound and no guarding.  Lymphadenopathy:    She has no cervical adenopathy.  Neurological: She is alert and oriented to person, place, and time. No cranial nerve deficit.  Skin: Skin is warm and dry. No rash noted.  Vitals reviewed.  Assessment/Plan: Visit for preventive health examination Depression screen negative. Health Maintenance reviewed. Preventive schedule discussed and handout given in AVS. Will obtain fasting labs today.   Hyperlipidemia Taking medications as directed. Dietary and exercise recommendations reviewed with patient. Will repeat fasting lipids today.  Essential hypertension BP stable. Asymptomatic. Will update labs today. Continue current regimen.     Leeanne Rio, PA-C

## 2018-07-26 NOTE — Patient Instructions (Signed)
Please go to the lab for blood work.   Our office will call you with your results unless you have chosen to receive results via MyChart.  If your blood work is normal we will follow-up each year for physicals and as scheduled for chronic medical problems.  If anything is abnormal we will treat accordingly and get you in for a follow-up.   Preventive Care 40-64 Years, Female Preventive care refers to lifestyle choices and visits with your health care provider that can promote health and wellness. What does preventive care include?  A yearly physical exam. This is also called an annual well check.  Dental exams once or twice a year.  Routine eye exams. Ask your health care provider how often you should have your eyes checked.  Personal lifestyle choices, including:  Daily care of your teeth and gums.  Regular physical activity.  Eating a healthy diet.  Avoiding tobacco and drug use.  Limiting alcohol use.  Practicing safe sex.  Taking low-dose aspirin daily starting at age 50.  Taking vitamin and mineral supplements as recommended by your health care provider. What happens during an annual well check? The services and screenings done by your health care provider during your annual well check will depend on your age, overall health, lifestyle risk factors, and family history of disease. Counseling  Your health care provider may ask you questions about your:  Alcohol use.  Tobacco use.  Drug use.  Emotional well-being.  Home and relationship well-being.  Sexual activity.  Eating habits.  Work and work environment.  Method of birth control.  Menstrual cycle.  Pregnancy history. Screening  You may have the following tests or measurements:  Height, weight, and BMI.  Blood pressure.  Lipid and cholesterol levels. These may be checked every 5 years, or more frequently if you are over 50 years old.  Skin check.  Lung cancer screening. You may have this  screening every year starting at age 55 if you have a 30-pack-year history of smoking and currently smoke or have quit within the past 15 years.  Fecal occult blood test (FOBT) of the stool. You may have this test every year starting at age 50.  Flexible sigmoidoscopy or colonoscopy. You may have a sigmoidoscopy every 5 years or a colonoscopy every 10 years starting at age 50.  Hepatitis C blood test.  Hepatitis B blood test.  Sexually transmitted disease (STD) testing.  Diabetes screening. This is done by checking your blood sugar (glucose) after you have not eaten for a while (fasting). You may have this done every 1-3 years.  Mammogram. This may be done every 1-2 years. Talk to your health care provider about when you should start having regular mammograms. This may depend on whether you have a family history of breast cancer.  BRCA-related cancer screening. This may be done if you have a family history of breast, ovarian, tubal, or peritoneal cancers.  Pelvic exam and Pap test. This may be done every 3 years starting at age 21. Starting at age 30, this may be done every 5 years if you have a Pap test in combination with an HPV test.  Bone density scan. This is done to screen for osteoporosis. You may have this scan if you are at high risk for osteoporosis. Discuss your test results, treatment options, and if necessary, the need for more tests with your health care provider. Vaccines  Your health care provider may recommend certain vaccines, such as:  Influenza vaccine.   is recommended every year.  Tetanus, diphtheria, and acellular pertussis (Tdap, Td) vaccine. You may need a Td booster every 10 years.  Varicella vaccine. You may need this if you have not been vaccinated.  Zoster vaccine. You may need this after age 10.  Measles, mumps, and rubella (MMR) vaccine. You may need at least one dose of MMR if you were born in 1957 or later. You may also need a second  dose.  Pneumococcal 13-valent conjugate (PCV13) vaccine. You may need this if you have certain conditions and were not previously vaccinated.  Pneumococcal polysaccharide (PPSV23) vaccine. You may need one or two doses if you smoke cigarettes or if you have certain conditions.  Meningococcal vaccine. You may need this if you have certain conditions.  Hepatitis A vaccine. You may need this if you have certain conditions or if you travel or work in places where you may be exposed to hepatitis A.  Hepatitis B vaccine. You may need this if you have certain conditions or if you travel or work in places where you may be exposed to hepatitis B.  Haemophilus influenzae type b (Hib) vaccine. You may need this if you have certain conditions.  Talk to your health care provider about which screenings and vaccines you need and how often you need them. This information is not intended to replace advice given to you by your health care provider. Make sure you discuss any questions you have with your health care provider. Document Released: 12/26/2015 Document Revised: 08/18/2016 Document Reviewed: 09/30/2015 Elsevier Interactive Patient Education  [employer].

## 2018-07-27 ENCOUNTER — Other Ambulatory Visit (INDEPENDENT_AMBULATORY_CARE_PROVIDER_SITE_OTHER): Payer: BLUE CROSS/BLUE SHIELD

## 2018-07-27 ENCOUNTER — Other Ambulatory Visit: Payer: Self-pay

## 2018-07-27 ENCOUNTER — Encounter: Payer: Self-pay | Admitting: Physician Assistant

## 2018-07-27 DIAGNOSIS — R7989 Other specified abnormal findings of blood chemistry: Secondary | ICD-10-CM

## 2018-07-27 LAB — T3, FREE: T3, Free: 3.6 pg/mL (ref 2.3–4.2)

## 2018-07-27 LAB — T4, FREE: FREE T4: 0.78 ng/dL (ref 0.60–1.60)

## 2018-07-28 NOTE — Telephone Encounter (Signed)
Pt called in and would like see if she could come in today to retest her TSH.  She stated she seen that it is so high. She is leaving to go out of town Sunday and would like to address this before she leaves.  She would like a call back today at:  Best number [phone]

## 2018-08-09 DIAGNOSIS — Z1371 Encounter for nonprocreative screening for genetic disease carrier status: Secondary | ICD-10-CM | POA: Diagnosis not present

## 2018-08-09 DIAGNOSIS — Z6832 Body mass index (BMI) 32.0-32.9, adult: Secondary | ICD-10-CM | POA: Diagnosis not present

## 2018-08-14 ENCOUNTER — Other Ambulatory Visit: Payer: Self-pay | Admitting: Physician Assistant

## 2018-08-14 DIAGNOSIS — I10 Essential (primary) hypertension: Secondary | ICD-10-CM

## 2018-10-29 ENCOUNTER — Other Ambulatory Visit: Payer: Self-pay | Admitting: Physician Assistant

## 2018-11-02 ENCOUNTER — Other Ambulatory Visit: Payer: Self-pay | Admitting: Emergency Medicine

## 2018-11-02 DIAGNOSIS — I10 Essential (primary) hypertension: Secondary | ICD-10-CM

## 2018-11-02 MED ORDER — LOSARTAN POTASSIUM 50 MG PO TABS
ORAL_TABLET | ORAL | 1 refills | Status: DC
Start: 2018-11-02 — End: 2019-03-07

## 2018-11-02 MED ORDER — ATORVASTATIN CALCIUM 20 MG PO TABS: ORAL_TABLET | ORAL | 1 refills | Status: DC

## 2018-11-02 MED ORDER — AMLODIPINE BESYLATE 5 MG PO TABS: ORAL_TABLET | ORAL | 1 refills | Status: DC

## 2018-11-14 ENCOUNTER — Other Ambulatory Visit: Payer: Self-pay | Admitting: Obstetrics and Gynecology

## 2018-11-14 DIAGNOSIS — Z1231 Encounter for screening mammogram for malignant neoplasm of breast: Secondary | ICD-10-CM

## 2018-12-20 ENCOUNTER — Ambulatory Visit
Admission: RE | Admit: 2018-12-20 | Discharge: 2018-12-20 | Disposition: A | Discharge status: Home / Self Care | Payer: BLUE CROSS/BLUE SHIELD | Source: Ambulatory Visit | Attending: Obstetrics and Gynecology | Admitting: Obstetrics and Gynecology

## 2018-12-20 DIAGNOSIS — Z1231 Encounter for screening mammogram for malignant neoplasm of breast: Secondary | ICD-10-CM | POA: Diagnosis not present

## 2019-03-07 ENCOUNTER — Other Ambulatory Visit: Payer: Self-pay | Admitting: Emergency Medicine

## 2019-03-07 DIAGNOSIS — I10 Essential (primary) hypertension: Secondary | ICD-10-CM

## 2019-03-07 DIAGNOSIS — E785 Hyperlipidemia, unspecified: Secondary | ICD-10-CM

## 2019-03-07 MED ORDER — ATORVASTATIN CALCIUM 20 MG PO TABS: ORAL_TABLET | ORAL | 1 refills | Status: DC

## 2019-03-07 MED ORDER — AMLODIPINE BESYLATE 5 MG PO TABS
ORAL_TABLET | ORAL | 1 refills | Status: DC
Start: 2019-03-07 — End: 2019-03-08

## 2019-03-07 MED ORDER — LOSARTAN POTASSIUM 50 MG PO TABS: ORAL_TABLET | ORAL | 1 refills | Status: DC

## 2019-03-08 ENCOUNTER — Encounter: Payer: Self-pay | Admitting: Physician Assistant

## 2019-03-08 DIAGNOSIS — I10 Essential (primary) hypertension: Secondary | ICD-10-CM

## 2019-03-08 DIAGNOSIS — E785 Hyperlipidemia, unspecified: Secondary | ICD-10-CM

## 2019-03-08 MED ORDER — AMLODIPINE BESYLATE 5 MG PO TABS: ORAL_TABLET | ORAL | 1 refills | Status: DC

## 2019-03-08 MED ORDER — LOSARTAN POTASSIUM 50 MG PO TABS: ORAL_TABLET | ORAL | 1 refills | Status: DC

## 2019-03-08 MED ORDER — ATORVASTATIN CALCIUM 20 MG PO TABS: ORAL_TABLET | ORAL | 1 refills | Status: DC

## 2019-08-19 DIAGNOSIS — H00011 Hordeolum externum right upper eyelid: Secondary | ICD-10-CM | POA: Diagnosis not present

## 2019-10-02 ENCOUNTER — Other Ambulatory Visit: Payer: Self-pay | Admitting: Physician Assistant

## 2019-10-02 ENCOUNTER — Encounter: Payer: Self-pay | Admitting: Emergency Medicine

## 2019-10-02 DIAGNOSIS — E785 Hyperlipidemia, unspecified: Secondary | ICD-10-CM

## 2019-10-02 DIAGNOSIS — I10 Essential (primary) hypertension: Secondary | ICD-10-CM

## 2019-10-09 ENCOUNTER — Ambulatory Visit (INDEPENDENT_AMBULATORY_CARE_PROVIDER_SITE_OTHER): Payer: BC Managed Care – PPO | Admitting: Physician Assistant

## 2019-10-09 ENCOUNTER — Other Ambulatory Visit: Payer: Self-pay

## 2019-10-09 ENCOUNTER — Encounter: Payer: Self-pay | Admitting: Physician Assistant

## 2019-10-09 VITALS — BP 118/78 | HR 77 | Temp 98.8°F | Resp 16 | Ht 67.0 in | Wt 198.0 lb

## 2019-10-09 DIAGNOSIS — I10 Essential (primary) hypertension: Secondary | ICD-10-CM

## 2019-10-09 DIAGNOSIS — E785 Hyperlipidemia, unspecified: Secondary | ICD-10-CM | POA: Diagnosis not present

## 2019-10-09 DIAGNOSIS — Z0000 Encounter for general adult medical examination without abnormal findings: Secondary | ICD-10-CM | POA: Diagnosis not present

## 2019-10-09 LAB — LIPID PANEL
Cholesterol: 209 mg/dL — ABNORMAL HIGH (ref 0–200)
HDL: 57.4 mg/dL (ref >39.00)
LDL Cholesterol: 126 mg/dL — ABNORMAL HIGH (ref 0–99)
NonHDL: 151.38
Total CHOL/HDL Ratio: 4
Triglycerides: 127 mg/dL (ref 0.0–149.0)
VLDL: 25.4 mg/dL (ref 0.0–40.0)

## 2019-10-09 LAB — CBC WITH DIFFERENTIAL/PLATELET
Basophils Absolute: 0.1 10*3/uL (ref 0.0–0.1)
Basophils Relative: 1.1 % (ref 0.0–3.0)
Eosinophils Absolute: 0.3 10*3/uL (ref 0.0–0.7)
Eosinophils Relative: 4.6 % (ref 0.0–5.0)
HCT: 43.1 % (ref 36.0–46.0)
Hemoglobin: 14.3 g/dL (ref 12.0–15.0)
Lymphocytes Relative: 21 % (ref 12.0–46.0)
Lymphs Abs: 1.2 10*3/uL (ref 0.7–4.0)
MCHC: 33.1 g/dL (ref 30.0–36.0)
MCV: 98.2 fl (ref 78.0–100.0)
Monocytes Absolute: 0.7 10*3/uL (ref 0.1–1.0)
Monocytes Relative: 11.3 % (ref 3.0–12.0)
Neutro Abs: 3.7 10*3/uL (ref 1.4–7.7)
Neutrophils Relative %: 62 % (ref 43.0–77.0)
Platelets: 342 10*3/uL (ref 150.0–400.0)
RBC: 4.39 Mil/uL (ref 3.87–5.11)
RDW: 13.4 % (ref 11.5–15.5)
WBC: 5.9 10*3/uL (ref 4.0–10.5)

## 2019-10-09 LAB — COMPREHENSIVE METABOLIC PANEL
ALT: 68 U/L — ABNORMAL HIGH (ref 0–35)
AST: 36 U/L (ref 0–37)
Albumin: 4.7 g/dL (ref 3.5–5.2)
Alkaline Phosphatase: 58 U/L (ref 39–117)
BUN: 11 mg/dL (ref 6–23)
CO2: 28 mEq/L (ref 19–32)
Calcium: 9.7 mg/dL (ref 8.4–10.5)
Chloride: 103 mEq/L (ref 96–112)
Creatinine, Ser: 0.71 mg/dL (ref 0.40–1.20)
GFR: 83.25 mL/min (ref >60.00)
Glucose, Bld: 97 mg/dL (ref 70–99)
Potassium: 4.3 mEq/L (ref 3.5–5.1)
Sodium: 139 mEq/L (ref 135–145)
Total Bilirubin: 0.7 mg/dL (ref 0.2–1.2)
Total Protein: 7.3 g/dL (ref 6.0–8.3)

## 2019-10-09 LAB — HEMOGLOBIN A1C: Hgb A1c MFr Bld: 5.5 % (ref 4.6–6.5)

## 2019-10-09 NOTE — Progress Notes (Signed)
Patient presents to clinic today for annual exam.  Patient is fasting for labs.  Diet -- cooking at home, well-balanced diet. Has decreased sugar and alcohol consumption  Physical activity -- outdoor walking   Acute Concerns: No acute concerns  Chronic Issues: Hypertension -- on Norvasc 5 mg QD and Losartan 50 mg QD, tolerating well, no side effects. Checks BP at home, averages around 117/68  Hyperlipidemia -- on Lipitor 20 mg QD, tolerating well  Asthma -- doing well, has not needed to use rescue inhaler or Symbicort. Denies wheezing, SOB, cough.    Health Maintenance: Immunizations -- flu vaccine UTD  Colonoscopy -- UTD Mammogram -- UTD PAP -- UTD   Past Medical History:  Diagnosis Date  . Allergy    seasonal & animal triggers  . Asthma   . Diverticulitis 2009 & 2011  . Hx of adenomatous colonic polyps 2004 & 2007  . Hyperlipidemia   . Hypertension     Past Surgical History:  Procedure Laterality Date  . ANTERIOR CRUCIATE LIGAMENT REPAIR  1995  . APPENDECTOMY    . BREAST BIOPSY  2005  . colonoscopy with polypectomy     Dr Cristina Gong    Current Outpatient Medications on File Prior to Visit  Medication Sig Dispense Refill  . albuterol (PROVENTIL HFA;VENTOLIN HFA) 108 (90 BASE) MCG/ACT inhaler Inhale 2 puffs into the lungs every 6 (six) hours as needed for wheezing or shortness of breath. 1 Inhaler 2  . amLODipine (NORVASC) 5 MG tablet TAKE 1 TABLET BY MOUTH EVERY DAY, Please schedule a physical appointment for more refills 90 tablet 1  . atorvastatin (LIPITOR) 20 MG tablet TAKE 1 TABLET BY MOUTH EVERY DAY, Please schedule a physical appointment, for more refills 90 tablet 0  . cholecalciferol (VITAMIN D) 1000 units tablet Take 1,000 Units by mouth daily.    Marland Kitchen losartan (COZAAR) 50 MG tablet TAKE 1 TABLET BY MOUTH EVERY DAY, Please schedule an physical appointment for more refills 90 tablet 0  . Multiple Vitamins-Minerals (MULTIVITAMIN GUMMIES ADULT PO) Take by mouth  daily.    . SYMBICORT 80-4.5 MCG/ACT inhaler INHALE TWO PUFFS BY MOUTH TWICE A DAY (Patient not taking: Reported on 10/09/2019) 10.2 g 2   No current facility-administered medications on file prior to visit.     Allergies  Allergen Reactions  . Codeine     Vomitting  . Sulfa Antibiotics Hives  . Lisinopril     cough    Family History  Problem Relation Age of Onset  . Colon cancer Father   . Stroke Father   . Hyperlipidemia Mother   . Breast cancer Mother   . Lung cancer Mother        2 lobectomies ; radiation  . Heart attack Maternal Aunt 46  . Heart attack Paternal Aunt 36       Bullous Pemphigoid; Hamman - Rich Syndrome  . Hyperlipidemia Other        5 M aunts  . Thyroid disease Other        92 M aunts  . Hypertension Other        multiple M aunts   . Breast cancer Other        43 M aunts  . GER disease Son   . Stroke Maternal Grandmother   . Diabetes Neg Hx     Social History   Socioeconomic History  . Marital status: Single    Spouse name: Not on file  . Number of children:  Not on file  . Years of education: Not on file  . Highest education level: Not on file  Occupational History  . Not on file  Social Needs  . Financial resource strain: Not on file  . Food insecurity    Worry: Not on file    Inability: Not on file  . Transportation needs    Medical: Not on file    Non-medical: Not on file  Tobacco Use  . Smoking status: Never Smoker  . Smokeless tobacco: Never Used  Substance and Sexual Activity  . Alcohol use: Yes    Alcohol/week: 5.0 standard drinks    Types: 5 Glasses of wine per week    Comment:  socially  . Drug use: No  . Sexual activity: Not Currently  Lifestyle  . Physical activity    Days per week: Not on file    Minutes per session: Not on file  . Stress: Not on file  Relationships  . Social Herbalist on phone: Not on file    Gets together: Not on file    Attends religious service: Not on file    Active member of  club or organization: Not on file    Attends meetings of clubs or organizations: Not on file    Relationship status: Not on file  . Intimate partner violence    Fear of current or ex partner: Not on file    Emotionally abused: Not on file    Physically abused: Not on file    Forced sexual activity: Not on file  Other Topics Concern  . Not on file  Social History Narrative  . Not on file    Review of Systems  Constitutional: Negative for chills and fever.  HENT: Negative for congestion, ear pain, hearing loss and tinnitus.   Eyes: Negative for blurred vision and double vision.  Respiratory: Negative for cough and shortness of breath.   Cardiovascular: Negative for chest pain, palpitations and leg swelling.  Gastrointestinal: Negative for abdominal pain, blood in stool, constipation, diarrhea, heartburn, melena, nausea and vomiting.  Genitourinary: Negative for dysuria, frequency and urgency.  Musculoskeletal: Negative for falls.  Neurological: Negative for dizziness, loss of consciousness and headaches.  Psychiatric/Behavioral: Negative for depression. The patient is not nervous/anxious and does not have insomnia.    Ht 5\' 7"  (1.702 m)   Wt 198 lb (89.8 kg)   BMI 31.01 kg/m   Physical Exam Vitals signs reviewed.  HENT:     Head: Normocephalic and atraumatic.     Right Ear: Tympanic membrane, ear canal and external ear normal.     Left Ear: Tympanic membrane, ear canal and external ear normal.     Nose: Nose normal. No mucosal edema.     Mouth/Throat:     Pharynx: Uvula midline. No oropharyngeal exudate or posterior oropharyngeal erythema.  Eyes:     Conjunctiva/sclera: Conjunctivae normal.     Pupils: Pupils are equal, round, and reactive to light.  Neck:     Musculoskeletal: Neck supple.     Thyroid: No thyromegaly.  Cardiovascular:     Rate and Rhythm: Normal rate and regular rhythm.     Heart sounds: Normal heart sounds.  Pulmonary:     Effort: Pulmonary effort is  normal. No respiratory distress.     Breath sounds: Normal breath sounds. No wheezing or rales.  Abdominal:     General: Bowel sounds are normal. There is no distension.     Palpations: Abdomen  is soft. There is no mass.     Tenderness: There is no abdominal tenderness. There is no guarding or rebound.  Lymphadenopathy:     Cervical: No cervical adenopathy.  Skin:    General: Skin is warm and dry.     Findings: No rash.  Neurological:     Mental Status: She is alert and oriented to person, place, and time.     Cranial Nerves: No cranial nerve deficit.     Assessment/Plan: 1. Essential hypertension BP normotensive. Asymptomatic. Continue current regimen. Repeat fasting labs today.   2. Hyperlipidemia, unspecified hyperlipidemia type Taking statin. Has made significant changes to diet and exercise. She is commended on this and encouraged to continue. Repeat fasting labs today. - Comprehensive metabolic panel - Lipid panel  3. Visit for preventive health examination Depression screen negative. Health Maintenance reviewed . Preventive schedule discussed and handout given in AVS. Will obtain fasting labs today. - CBC with Differential/Platelet - Comprehensive metabolic panel - Lipid panel - Hemoglobin A1c - TSH   Leeanne Rio, PA-C

## 2019-10-09 NOTE — Patient Instructions (Signed)
Please go to the lab for blood work.   Our office will call you with your results unless you have chosen to receive results via MyChart.  If your blood work is normal we will follow-up each year for physicals and as scheduled for chronic medical problems.  If anything is abnormal we will treat accordingly and get you in for a follow-up.   Preventive Care 40-62 Years Old, Female Preventive care refers to visits with your health care provider and lifestyle choices that can promote health and wellness. This includes:  A yearly physical exam. This may also be called an annual well check.  Regular dental visits and eye exams.  Immunizations.  Screening for certain conditions.  Healthy lifestyle choices, such as eating a healthy diet, getting regular exercise, not using drugs or products that contain nicotine and tobacco, and limiting alcohol use. What can I expect for my preventive care visit? Physical exam Your health care provider will check your:  Height and weight. This may be used to calculate body mass index (BMI), which tells if you are at a healthy weight.  Heart rate and blood pressure.  Skin for abnormal spots. Counseling Your health care provider may ask you questions about your:  Alcohol, tobacco, and drug use.  Emotional well-being.  Home and relationship well-being.  Sexual activity.  Eating habits.  Work and work environment.  Method of birth control.  Menstrual cycle.  Pregnancy history. What immunizations do I need?  Influenza (flu) vaccine  This is recommended every year. Tetanus, diphtheria, and pertussis (Tdap) vaccine  You may need a Td booster every 10 years. Varicella (chickenpox) vaccine  You may need this if you have not been vaccinated. Zoster (shingles) vaccine  You may need this after age 60. Measles, mumps, and rubella (MMR) vaccine  You may need at least one dose of MMR if you were born in 1957 or later. You may also need a  second dose. Pneumococcal conjugate (PCV13) vaccine  You may need this if you have certain conditions and were not previously vaccinated. Pneumococcal polysaccharide (PPSV23) vaccine  You may need one or two doses if you smoke cigarettes or if you have certain conditions. Meningococcal conjugate (MenACWY) vaccine  You may need this if you have certain conditions. Hepatitis A vaccine  You may need this if you have certain conditions or if you travel or work in places where you may be exposed to hepatitis A. Hepatitis B vaccine  You may need this if you have certain conditions or if you travel or work in places where you may be exposed to hepatitis B. Haemophilus influenzae type b (Hib) vaccine  You may need this if you have certain conditions. Human papillomavirus (HPV) vaccine  If recommended by your health care provider, you may need three doses over 6 months. You may receive vaccines as individual doses or as more than one vaccine together in one shot (combination vaccines). Talk with your health care provider about the risks and benefits of combination vaccines. What tests do I need? Blood tests  Lipid and cholesterol levels. These may be checked every 5 years, or more frequently if you are over 50 years old.  Hepatitis C test.  Hepatitis B test. Screening  Lung cancer screening. You may have this screening every year starting at age 55 if you have a 30-pack-year history of smoking and currently smoke or have quit within the past 15 years.  Colorectal cancer screening. All adults should have this screening starting   at age 59 and continuing until age 23. Your health care provider may recommend screening at age 34 if you are at increased risk. You will have tests every 1-10 years, depending on your results and the type of screening test.  Diabetes screening. This is done by checking your blood sugar (glucose) after you have not eaten for a while (fasting). You may have this done  every 1-3 years.  Mammogram. This may be done every 1-2 years. Talk with your health care provider about when you should start having regular mammograms. This may depend on whether you have a family history of breast cancer.  BRCA-related cancer screening. This may be done if you have a family history of breast, ovarian, tubal, or peritoneal cancers.  Pelvic exam and Pap test. This may be done every 3 years starting at age 6. Starting at age 65, this may be done every 5 years if you have a Pap test in combination with an HPV test. Other tests  Sexually transmitted disease (STD) testing.  Bone density scan. This is done to screen for osteoporosis. You may have this scan if you are at high risk for osteoporosis. Follow these instructions at home: Eating and drinking  Eat a diet that includes fresh fruits and vegetables, whole grains, lean protein, and low-fat dairy.  Take vitamin and mineral supplements as recommended by your health care provider.  Do not drink alcohol if: ? Your health care provider tells you not to drink. ? You are pregnant, may be pregnant, or are planning to become pregnant.  If you drink alcohol: ? Limit how much you have to 0-1 drink a day. ? Be aware of how much alcohol is in your drink. In the [place] one drink equals one 12 oz bottle of beer (355 mL), one 5 oz glass of wine (148 mL), or one 1 oz glass of hard liquor (44 mL). Lifestyle  Take daily care of your teeth and gums.  Stay active. Exercise for at least 30 minutes on 5 or more days each week.  Do not use any products that contain nicotine or tobacco, such as cigarettes, e-cigarettes, and chewing tobacco. If you need help quitting, ask your health care provider.  If you are sexually active, practice safe sex. Use a condom or other form of birth control (contraception) in order to prevent pregnancy and STIs (sexually transmitted infections).  If told by your health care provider, take low-dose aspirin  daily starting at age 65. What's next?  Visit your health care provider once a year for a well check visit.  Ask your health care provider how often you should have your eyes and teeth checked.  Stay up to date on all vaccines. This information is not intended to replace advice given to you by your health care provider. Make sure you discuss any questions you have with your health care provider. Document Released: 12/26/2015 Document Revised: 08/10/2018 Document Reviewed: 08/10/2018 Elsevier Patient Education  2020 [employer]. .

## 2019-10-11 LAB — TSH: TSH: 3.95 u[IU]/mL (ref 0.35–4.50)

## 2019-10-17 ENCOUNTER — Encounter: Payer: Self-pay | Admitting: Physician Assistant

## 2019-12-18 ENCOUNTER — Other Ambulatory Visit: Payer: Self-pay | Admitting: Obstetrics and Gynecology

## 2019-12-18 DIAGNOSIS — Z1231 Encounter for screening mammogram for malignant neoplasm of breast: Secondary | ICD-10-CM

## 2019-12-20 ENCOUNTER — Other Ambulatory Visit: Payer: Self-pay | Admitting: Emergency Medicine

## 2019-12-20 DIAGNOSIS — E785 Hyperlipidemia, unspecified: Secondary | ICD-10-CM

## 2019-12-20 DIAGNOSIS — I10 Essential (primary) hypertension: Secondary | ICD-10-CM

## 2019-12-20 MED ORDER — LOSARTAN POTASSIUM 50 MG PO TABS: ORAL_TABLET | ORAL | 1 refills | Status: DC

## 2019-12-20 MED ORDER — ATORVASTATIN CALCIUM 20 MG PO TABS: ORAL_TABLET | ORAL | 1 refills | Status: DC

## 2020-01-11 DIAGNOSIS — Z6827 Body mass index (BMI) 27.0-27.9, adult: Secondary | ICD-10-CM | POA: Diagnosis not present

## 2020-01-11 DIAGNOSIS — Z01419 Encounter for gynecological examination (general) (routine) without abnormal findings: Secondary | ICD-10-CM | POA: Diagnosis not present

## 2020-01-25 ENCOUNTER — Other Ambulatory Visit: Payer: Self-pay

## 2020-01-25 ENCOUNTER — Ambulatory Visit
Admission: RE | Admit: 2020-01-25 | Discharge: 2020-01-25 | Disposition: A | Discharge status: Home / Self Care | Payer: BC Managed Care – PPO | Source: Ambulatory Visit | Attending: Obstetrics and Gynecology | Admitting: Obstetrics and Gynecology

## 2020-01-25 DIAGNOSIS — Z1231 Encounter for screening mammogram for malignant neoplasm of breast: Secondary | ICD-10-CM | POA: Diagnosis not present

## 2020-02-15 ENCOUNTER — Ambulatory Visit: Payer: BC Managed Care – PPO | Attending: Internal Medicine

## 2020-02-15 DIAGNOSIS — Z23 Encounter for immunization: Secondary | ICD-10-CM

## 2020-02-15 NOTE — Progress Notes (Signed)
   Covid-19 Vaccination Clinic  Name:  Nathaly Chadwick    MRN: [id number] DOB: 1956-05-27  02/15/2020  Ms. Behrle was observed post Covid-19 immunization for 15 minutes without incident. She was provided with Vaccine Information Sheet and instruction to access the V-Safe system.   Ms. Dellis was instructed to call 911 with any severe reactions post vaccine: Marland Kitchen Difficulty breathing  . Swelling of face and throat  . A fast heartbeat  . A bad rash all over body  . Dizziness and weakness

## 2020-02-25 ENCOUNTER — Encounter: Payer: Self-pay | Admitting: Physician Assistant

## 2020-02-25 DIAGNOSIS — R748 Abnormal levels of other serum enzymes: Secondary | ICD-10-CM

## 2020-02-28 ENCOUNTER — Ambulatory Visit (INDEPENDENT_AMBULATORY_CARE_PROVIDER_SITE_OTHER): Payer: BC Managed Care – PPO

## 2020-02-28 ENCOUNTER — Other Ambulatory Visit: Payer: Self-pay

## 2020-02-28 DIAGNOSIS — R748 Abnormal levels of other serum enzymes: Secondary | ICD-10-CM | POA: Diagnosis not present

## 2020-02-28 LAB — ALT: ALT: 17 U/L (ref 0–35)

## 2020-02-28 LAB — AST: AST: 14 U/L (ref 0–37)

## 2020-02-28 LAB — ALKALINE PHOSPHATASE: Alkaline Phosphatase: 58 U/L (ref 39–117)

## 2020-03-05 ENCOUNTER — Other Ambulatory Visit: Payer: Self-pay | Admitting: Emergency Medicine

## 2020-03-05 DIAGNOSIS — I10 Essential (primary) hypertension: Secondary | ICD-10-CM

## 2020-03-05 MED ORDER — AMLODIPINE BESYLATE 5 MG PO TABS
ORAL_TABLET | ORAL | 1 refills | Status: DC
Start: 2020-03-05 — End: 2020-09-09

## 2020-03-07 ENCOUNTER — Ambulatory Visit: Payer: BC Managed Care – PPO | Attending: Internal Medicine

## 2020-03-07 ENCOUNTER — Ambulatory Visit: Payer: BC Managed Care – PPO

## 2020-03-07 DIAGNOSIS — Z23 Encounter for immunization: Secondary | ICD-10-CM

## 2020-03-07 NOTE — Progress Notes (Signed)
   Covid-19 Vaccination Clinic  Name:  Saraye Lazcano    MRN: [id number] DOB: 07/21/1956  03/07/2020  Ms. Payan was observed post Covid-19 immunization for 15 minutes without incident. She was provided with Vaccine Information Sheet and instruction to access the V-Safe system.   Ms. Olds was instructed to call 911 with any severe reactions post vaccine: Marland Kitchen Difficulty breathing  . Swelling of face and throat  . A fast heartbeat  . A bad rash all over body  . Dizziness and weakness   Immunizations Administered    Name Date Dose VIS Date Route   Pfizer COVID-19 Vaccine 03/07/2020  2:25 PM 0.3 mL 11/23/2019 Intramuscular   Manufacturer: [place]   Lot: [id number]   [place]: [id number]

## 2020-03-18 ENCOUNTER — Ambulatory Visit: Payer: BC Managed Care – PPO

## 2020-04-23 ENCOUNTER — Ambulatory Visit (INDEPENDENT_AMBULATORY_CARE_PROVIDER_SITE_OTHER): Payer: BC Managed Care – PPO | Admitting: Physician Assistant

## 2020-04-23 ENCOUNTER — Encounter: Payer: Self-pay | Admitting: Physician Assistant

## 2020-04-23 ENCOUNTER — Other Ambulatory Visit: Payer: Self-pay

## 2020-04-23 VITALS — BP 114/70 | HR 104 | Temp 98.3°F | Resp 16 | Wt 169.4 lb

## 2020-04-23 DIAGNOSIS — K432 Incisional hernia without obstruction or gangrene: Secondary | ICD-10-CM

## 2020-04-23 NOTE — Progress Notes (Signed)
Patient presents to clinic today c/o potential hernia at site of her C-section scar.  Patient had C-section with the birth of her daughter 30 years ago.  Has recently lost quite a bit of weight through diet and exercise.  States she noticed a pain in the area the other day and when palpating the area noted mass at the site of her scar, underneath the skin.  Patient denies any ongoing pain.  Denies any change to bowel or bladder habits.  Wanted this assessed today.   Past Medical History:  Diagnosis Date  . Allergy    seasonal & animal triggers  . Asthma   . Diverticulitis 2009 & 2011  . Hx of adenomatous colonic polyps 2004 & 2007  . Hyperlipidemia   . Hypertension     Current Outpatient Medications on File Prior to Visit  Medication Sig Dispense Refill  . albuterol (PROVENTIL HFA;VENTOLIN HFA) 108 (90 BASE) MCG/ACT inhaler Inhale 2 puffs into the lungs every 6 (six) hours as needed for wheezing or shortness of breath. 1 Inhaler 2  . amLODipine (NORVASC) 5 MG tablet TAKE 1 TABLET BY MOUTH EVERY DAY 90 tablet 1  . atorvastatin (LIPITOR) 20 MG tablet TAKE 1 TABLET BY MOUTH EVERY DAY 90 tablet 1  . cholecalciferol (VITAMIN D) 1000 units tablet Take 1,000 Units by mouth daily.    . Multiple Vitamins-Minerals (MULTIVITAMIN GUMMIES ADULT PO) Take by mouth daily.    . SYMBICORT 80-4.5 MCG/ACT inhaler INHALE TWO PUFFS BY MOUTH TWICE A DAY 10.2 g 2  . losartan (COZAAR) 50 MG tablet TAKE 1 TABLET BY MOUTH EVERY DAY (Patient not taking: Reported on 04/23/2020) 90 tablet 1   No current facility-administered medications on file prior to visit.    Allergies  Allergen Reactions  . Codeine     Vomitting  . Sulfa Antibiotics Hives  . Lisinopril     cough    Family History  Problem Relation Age of Onset  . Colon cancer Father   . Stroke Father   . Hyperlipidemia Mother   . Breast cancer Mother   . Lung cancer Mother        2 lobectomies ; radiation  . Heart attack Maternal Aunt 46  .  Heart attack Paternal Aunt 61       Bullous Pemphigoid; Hamman - Rich Syndrome  . Hyperlipidemia Other        5 M aunts  . Thyroid disease Other        66 M aunts  . Hypertension Other        multiple M aunts   . Breast cancer Other        61 M aunts  . GER disease Son   . Stroke Maternal Grandmother   . Diabetes Neg Hx     Social History   Socioeconomic History  . Marital status: Single    Spouse name: Not on file  . Number of children: Not on file  . Years of education: Not on file  . Highest education level: Not on file  Occupational History  . Not on file  Tobacco Use  . Smoking status: Never Smoker  . Smokeless tobacco: Never Used  Substance and Sexual Activity  . Alcohol use: Not Currently    Alcohol/week: 5.0 standard drinks    Types: 5 Glasses of wine per week  . Drug use: No  . Sexual activity: Not Currently  Other Topics Concern  . Not on file  Social History Narrative  .  Not on file   Social Determinants of Health   Financial Resource Strain:   . Difficulty of Paying Living Expenses:   Food Insecurity:   . Worried About Charity fundraiser in the Last Year:   . Arboriculturist in the Last Year:   Transportation Needs:   . Film/video editor (Medical):   Marland Kitchen Lack of Transportation (Non-Medical):   Physical Activity:   . Days of Exercise per Week:   . Minutes of Exercise per Session:   Stress:   . Feeling of Stress :   Social Connections:   . Frequency of Communication with Friends and Family:   . Frequency of Social Gatherings with Friends and Family:   . Attends Religious Services:   . Active Member of Clubs or Organizations:   . Attends Archivist Meetings:   Marland Kitchen Marital Status:    Review of Systems - See HPI.  All other ROS are negative.  BP 114/70   Pulse (!) 104   Temp 98.3 F (36.8 C)   Resp 16   Wt 169 lb 6.4 oz (76.8 kg)   SpO2 98%   BMI 26.53 kg/m   Physical Exam Vitals reviewed.  Constitutional:      Appearance:  Normal appearance.  HENT:     Head: Normocephalic and atraumatic.  Cardiovascular:     Rate and Rhythm: Normal rate and regular rhythm.     Pulses: Normal pulses.     Heart sounds: Normal heart sounds.  Pulmonary:     Effort: Pulmonary effort is normal.     Breath sounds: Normal breath sounds.  Abdominal:    Musculoskeletal:     Cervical back: Neck supple.  Neurological:     General: No focal deficit present.     Mental Status: She is alert and oriented to person, place, and time.  Psychiatric:        Mood and Affect: Mood normal.     Recent Results (from the past 2160 hour(s))  Alkaline phosphatase     Status: None   Collection Time: 02/28/20 10:24 AM  Result Value Ref Range   Alkaline Phosphatase 58 39 - 117 U/L  ALT     Status: None   Collection Time: 02/28/20 10:24 AM  Result Value Ref Range   ALT 17 0 - 35 U/L  AST     Status: None   Collection Time: 02/28/20 10:24 AM  Result Value Ref Range   AST 14 0 - 37 U/L    Assessment/Plan: 1. Incisional hernia, without obstruction or gangrene Incisional hernia versus other etiology.  Nonpainful with palpation.  No evidence of incarceration or infection.  Referral to general surgery for further evaluation and management place. - Ambulatory referral to General Surgery  This visit occurred during the SARS-CoV-2 public health emergency.  Safety protocols were in place, including screening questions prior to the visit, additional usage of staff PPE, and extensive cleaning of exam room while observing appropriate contact time as indicated for disinfecting solutions.    Leeanne Rio, PA-C

## 2020-04-24 NOTE — Patient Instructions (Signed)
Patient declined AVS 

## 2020-04-28 ENCOUNTER — Other Ambulatory Visit: Payer: Self-pay | Admitting: Surgery

## 2020-04-28 ENCOUNTER — Ambulatory Visit: Payer: Self-pay | Admitting: Surgery

## 2020-04-28 DIAGNOSIS — R1904 Left lower quadrant abdominal swelling, mass and lump: Secondary | ICD-10-CM | POA: Diagnosis not present

## 2020-04-29 ENCOUNTER — Other Ambulatory Visit: Payer: Self-pay | Admitting: Surgery

## 2020-05-01 ENCOUNTER — Ambulatory Visit
Admission: RE | Admit: 2020-05-01 | Discharge: 2020-05-01 | Disposition: A | Discharge status: Home / Self Care | Payer: BC Managed Care – PPO | Source: Ambulatory Visit | Attending: Surgery | Admitting: Surgery

## 2020-05-01 DIAGNOSIS — K5730 Diverticulosis of large intestine without perforation or abscess without bleeding: Secondary | ICD-10-CM | POA: Diagnosis not present

## 2020-05-01 DIAGNOSIS — R1904 Left lower quadrant abdominal swelling, mass and lump: Secondary | ICD-10-CM

## 2020-05-01 MED ORDER — IOPAMIDOL (ISOVUE-300) INJECTION 61%
100.0000 mL | Freq: Once | INTRAVENOUS | Status: AC | PRN
  Administered 2020-05-01: 100 mL via INTRAVENOUS

## 2020-05-26 ENCOUNTER — Other Ambulatory Visit: Payer: Self-pay | Admitting: Physician Assistant

## 2020-05-26 DIAGNOSIS — E785 Hyperlipidemia, unspecified: Secondary | ICD-10-CM

## 2020-05-26 MED ORDER — ATORVASTATIN CALCIUM 20 MG PO TABS: ORAL_TABLET | ORAL | 1 refills | Status: DC

## 2020-05-29 ENCOUNTER — Other Ambulatory Visit: Payer: Self-pay | Admitting: Physician Assistant

## 2020-05-29 DIAGNOSIS — E785 Hyperlipidemia, unspecified: Secondary | ICD-10-CM

## 2020-05-29 MED ORDER — ATORVASTATIN CALCIUM 20 MG PO TABS: ORAL_TABLET | ORAL | 1 refills | Status: DC

## 2020-09-09 ENCOUNTER — Other Ambulatory Visit: Payer: Self-pay | Admitting: Emergency Medicine

## 2020-09-09 DIAGNOSIS — I10 Essential (primary) hypertension: Secondary | ICD-10-CM

## 2020-09-09 MED ORDER — AMLODIPINE BESYLATE 5 MG PO TABS: ORAL_TABLET | ORAL | 1 refills | Status: DC

## 2020-09-10 ENCOUNTER — Telehealth: Payer: Self-pay | Admitting: Physician Assistant

## 2020-09-10 DIAGNOSIS — I10 Essential (primary) hypertension: Secondary | ICD-10-CM

## 2020-09-10 MED ORDER — AMLODIPINE BESYLATE 5 MG PO TABS: ORAL_TABLET | ORAL | 1 refills | Status: DC

## 2020-09-10 NOTE — Telephone Encounter (Signed)
..  Medication Refills  Medication:amlodipine 5mg     Pharmacy:[facility] - mail order pharmacy - telephone - [phone]   ** Let patient know to contact pharmacy at the end of the day to make sure medication is ready.**  ** Please notify patient to allow 48-72 hours to process.**  ** Encourage patient to contact the pharmacy for refills or they can request refills through [facility]**  Clinical Fills out below:   Last refill:  QTY:  Refill Date:    Other Comments:   Okay for refill?  Please advise.

## 2020-09-10 NOTE — Telephone Encounter (Signed)
Rx sent to the preferred patient pharmacy  

## 2020-10-09 ENCOUNTER — Encounter: Payer: Self-pay | Admitting: Physician Assistant

## 2020-10-09 ENCOUNTER — Encounter: Payer: BC Managed Care – PPO | Admitting: Physician Assistant

## 2020-10-09 ENCOUNTER — Ambulatory Visit (INDEPENDENT_AMBULATORY_CARE_PROVIDER_SITE_OTHER): Payer: BC Managed Care – PPO | Admitting: Physician Assistant

## 2020-10-09 ENCOUNTER — Other Ambulatory Visit: Payer: Self-pay

## 2020-10-09 VITALS — BP 118/70 | HR 80 | Temp 98.2°F | Resp 14 | Ht 67.0 in | Wt 163.0 lb

## 2020-10-09 DIAGNOSIS — Z0000 Encounter for general adult medical examination without abnormal findings: Secondary | ICD-10-CM

## 2020-10-09 DIAGNOSIS — I10 Essential (primary) hypertension: Secondary | ICD-10-CM

## 2020-10-09 DIAGNOSIS — E785 Hyperlipidemia, unspecified: Secondary | ICD-10-CM | POA: Diagnosis not present

## 2020-10-09 LAB — LIPID PANEL
Cholesterol: 201 mg/dL — ABNORMAL HIGH (ref 0–200)
HDL: 64.4 mg/dL (ref >39.00)
LDL Cholesterol: 112 mg/dL — ABNORMAL HIGH (ref 0–99)
NonHDL: 136.56
Total CHOL/HDL Ratio: 3
Triglycerides: 124 mg/dL (ref 0.0–149.0)
VLDL: 24.8 mg/dL (ref 0.0–40.0)

## 2020-10-09 LAB — COMPREHENSIVE METABOLIC PANEL
ALT: 15 U/L (ref 0–35)
AST: 15 U/L (ref 0–37)
Albumin: 4.5 g/dL (ref 3.5–5.2)
Alkaline Phosphatase: 50 U/L (ref 39–117)
BUN: 12 mg/dL (ref 6–23)
CO2: 29 mEq/L (ref 19–32)
Calcium: 9.7 mg/dL (ref 8.4–10.5)
Chloride: 102 mEq/L (ref 96–112)
Creatinine, Ser: 0.79 mg/dL (ref 0.40–1.20)
GFR: 79.51 mL/min (ref >60.00)
Glucose, Bld: 81 mg/dL (ref 70–99)
Potassium: 4.4 mEq/L (ref 3.5–5.1)
Sodium: 140 mEq/L (ref 135–145)
Total Bilirubin: 0.6 mg/dL (ref 0.2–1.2)
Total Protein: 6.7 g/dL (ref 6.0–8.3)

## 2020-10-09 LAB — CBC WITH DIFFERENTIAL/PLATELET
Basophils Absolute: 0.1 10*3/uL (ref 0.0–0.1)
Basophils Relative: 1.3 % (ref 0.0–3.0)
Eosinophils Absolute: 0.4 10*3/uL (ref 0.0–0.7)
Eosinophils Relative: 6.6 % — ABNORMAL HIGH (ref 0.0–5.0)
HCT: 42.4 % (ref 36.0–46.0)
Hemoglobin: 14.2 g/dL (ref 12.0–15.0)
Lymphocytes Relative: 26.3 % (ref 12.0–46.0)
Lymphs Abs: 1.5 10*3/uL (ref 0.7–4.0)
MCHC: 33.4 g/dL (ref 30.0–36.0)
MCV: 96 fl (ref 78.0–100.0)
Monocytes Absolute: 0.7 10*3/uL (ref 0.1–1.0)
Monocytes Relative: 11.1 % (ref 3.0–12.0)
Neutro Abs: 3.2 10*3/uL (ref 1.4–7.7)
Neutrophils Relative %: 54.7 % (ref 43.0–77.0)
Platelets: 334 10*3/uL (ref 150.0–400.0)
RBC: 4.42 Mil/uL (ref 3.87–5.11)
RDW: 13.1 % (ref 11.5–15.5)
WBC: 5.9 10*3/uL (ref 4.0–10.5)

## 2020-10-09 LAB — VITAMIN D 25 HYDROXY (VIT D DEFICIENCY, FRACTURES): VITD: 38.68 ng/mL (ref 30.00–100.00)

## 2020-10-09 LAB — HEMOGLOBIN A1C: Hgb A1c MFr Bld: 5.6 % (ref 4.6–6.5)

## 2020-10-09 NOTE — Progress Notes (Signed)
Patient presents to clinic today for annual exam.  Patient is fasting for labs.  Acute Concerns: Denies acute concerns at today's visit.   Chronic Issues: Hypertension -- Currently on a regimen of Amlodipine 5 mg daily. Is taking as directed and tolerating well. Keeps routine check on BP and noting 105-115/60-70s. Patient denies chest pain, palpitations, lightheadedness, dizziness, vision changes or frequent headaches.  BP Readings from Last 3 Encounters:  10/09/20 118/70  04/23/20 114/70  10/09/19 118/78   Hyperlipidemia -- Taking Atorvastatin as directed. Keeping a well-balanced diet. Has increased exercise recently with noted weight loss. Body mass index is 25.53 kg/m.Marland Kitchen   Health Maintenance: Immunizations -- up-to-date.  Colon Cancer Screening -- up-to-date Mammogram -- up-to-date PAP -- up-to-date  Past Medical History:  Diagnosis Date  . Allergy    seasonal & animal triggers  . Asthma   . Diverticulitis 2009 & 2011  . Hx of adenomatous colonic polyps 2004 & 2007  . Hyperlipidemia   . Hypertension     Past Surgical History:  Procedure Laterality Date  . ANTERIOR CRUCIATE LIGAMENT REPAIR  1995  . APPENDECTOMY    . BREAST BIOPSY  2005  . colonoscopy with polypectomy     Dr Cristina Gong    Current Outpatient Medications on File Prior to Visit  Medication Sig Dispense Refill  . albuterol (PROVENTIL HFA;VENTOLIN HFA) 108 (90 BASE) MCG/ACT inhaler Inhale 2 puffs into the lungs every 6 (six) hours as needed for wheezing or shortness of breath. 1 Inhaler 2  . amLODipine (NORVASC) 5 MG tablet TAKE 1 TABLET BY MOUTH EVERY DAY 90 tablet 1  . atorvastatin (LIPITOR) 20 MG tablet TAKE 1 TABLET BY MOUTH EVERY DAY 90 tablet 1  . cholecalciferol (VITAMIN D) 1000 units tablet Take 1,000 Units by mouth daily.    . Multiple Vitamins-Minerals (MULTIVITAMIN GUMMIES ADULT PO) Take by mouth daily.    . SYMBICORT 80-4.5 MCG/ACT inhaler INHALE TWO PUFFS BY MOUTH TWICE A DAY (Patient not  taking: Reported on 10/09/2020) 10.2 g 2   No current facility-administered medications on file prior to visit.    Allergies  Allergen Reactions  . Codeine     Vomitting  . Sulfa Antibiotics Hives  . Lisinopril     cough    Family History  Problem Relation Age of Onset  . Colon cancer Father   . Stroke Father   . Hyperlipidemia Mother   . Breast cancer Mother   . Lung cancer Mother        2 lobectomies ; radiation  . Heart attack Maternal Aunt 46  . Heart attack Paternal Aunt 55       Bullous Pemphigoid; Hamman - Rich Syndrome  . Hyperlipidemia Other        5 M aunts  . Thyroid disease Other        55 M aunts  . Hypertension Other        multiple M aunts   . Breast cancer Other        27 M aunts  . GER disease Son   . Stroke Maternal Grandmother   . Diabetes Neg Hx     Social History   Socioeconomic History  . Marital status: Single    Spouse name: Not on file  . Number of children: Not on file  . Years of education: Not on file  . Highest education level: Not on file  Occupational History  . Not on file  Tobacco Use  . Smoking status:  Never Smoker  . Smokeless tobacco: Never Used  Vaping Use  . Vaping Use: Never used  Substance and Sexual Activity  . Alcohol use: Not Currently    Alcohol/week: 5.0 standard drinks    Types: 5 Glasses of wine per week  . Drug use: No  . Sexual activity: Not Currently  Other Topics Concern  . Not on file  Social History Narrative  . Not on file   Social Determinants of Health   Financial Resource Strain:   . Difficulty of Paying Living Expenses: Not on file  Food Insecurity:   . Worried About Charity fundraiser in the Last Year: Not on file  . Ran Out of Food in the Last Year: Not on file  Transportation Needs:   . Lack of Transportation (Medical): Not on file  . Lack of Transportation (Non-Medical): Not on file  Physical Activity:   . Days of Exercise per Week: Not on file  . Minutes of Exercise per Session:  Not on file  Stress:   . Feeling of Stress : Not on file  Social Connections:   . Frequency of Communication with Friends and Family: Not on file  . Frequency of Social Gatherings with Friends and Family: Not on file  . Attends Religious Services: Not on file  . Active Member of Clubs or Organizations: Not on file  . Attends Archivist Meetings: Not on file  . Marital Status: Not on file  Intimate Partner Violence:   . Fear of Current or Ex-Partner: Not on file  . Emotionally Abused: Not on file  . Physically Abused: Not on file  . Sexually Abused: Not on file   Review of Systems  Constitutional: Negative for fever and weight loss.  HENT: Negative for ear discharge, ear pain, hearing loss and tinnitus.   Eyes: Negative for blurred vision, double vision, photophobia and pain.  Respiratory: Negative for cough and shortness of breath.   Cardiovascular: Negative for chest pain and palpitations.  Gastrointestinal: Negative for abdominal pain, blood in stool, constipation, diarrhea, heartburn, melena, nausea and vomiting.  Genitourinary: Negative for dysuria, flank pain, frequency, hematuria and urgency.  Musculoskeletal: Negative for falls.  Neurological: Negative for dizziness, loss of consciousness and headaches.  Endo/Heme/Allergies: Negative for environmental allergies.  Psychiatric/Behavioral: Negative for depression, hallucinations, substance abuse and suicidal ideas. The patient is not nervous/anxious and does not have insomnia.    BP 118/70   Pulse 80   Temp 98.2 F (36.8 C) (Temporal)   Resp 14   Ht 5\' 7"  (1.702 m)   Wt 163 lb (73.9 kg)   SpO2 99%   BMI 25.53 kg/m   Physical Exam Vitals reviewed.  HENT:     Head: Normocephalic and atraumatic.     Right Ear: Tympanic membrane, ear canal and external ear normal.     Left Ear: Tympanic membrane, ear canal and external ear normal.     Nose: Nose normal. No mucosal edema.     Mouth/Throat:     Pharynx: Uvula  midline. No oropharyngeal exudate or posterior oropharyngeal erythema.  Eyes:     Conjunctiva/sclera: Conjunctivae normal.     Pupils: Pupils are equal, round, and reactive to light.  Neck:     Thyroid: No thyromegaly.  Cardiovascular:     Rate and Rhythm: Normal rate and regular rhythm.     Heart sounds: Normal heart sounds.  Pulmonary:     Effort: Pulmonary effort is normal. No respiratory distress.  Breath sounds: Normal breath sounds. No wheezing or rales.  Abdominal:     General: Bowel sounds are normal. There is no distension.     Palpations: Abdomen is soft. There is no mass.     Tenderness: There is no abdominal tenderness. There is no guarding or rebound.  Musculoskeletal:     Cervical back: Neck supple.  Lymphadenopathy:     Cervical: No cervical adenopathy.  Skin:    General: Skin is warm and dry.     Findings: No rash.  Neurological:     Mental Status: She is alert and oriented to person, place, and time.     Cranial Nerves: No cranial nerve deficit.    Assessment/Plan: 1. Visit for preventive health examination Depression screen negative. Health Maintenance reviewed. Preventive schedule discussed and handout given in AVS. Will obtain fasting labs today.  - CBC with Differential/Platelet - Vitamin D (25 hydroxy)  2. Essential hypertension BP normotensive. Asymptomatic. Continue current regimen.  - Comprehensive metabolic panel - Hemoglobin A1c - Lipid panel  3. Hyperlipidemia, unspecified hyperlipidemia type Taking statin as directed. Well-balanced diet overall. Working hard on exercise. Repeat fasting labs today. Hopefully will be able to reduce statin dose.  - Comprehensive metabolic panel - Hemoglobin A1c - Lipid panel  This visit occurred during the SARS-CoV-2 public health emergency.  Safety protocols were in place, including screening questions prior to the visit, additional usage of staff PPE, and extensive cleaning of exam room while observing  appropriate contact time as indicated for disinfecting solutions.    Leeanne Rio, PA-C

## 2020-10-09 NOTE — Patient Instructions (Signed)
Please go to the lab for blood work.   Our office will call you with your results unless you have chosen to receive results via MyChart.  If your blood work is normal we will follow-up each year for physicals and as scheduled for chronic medical problems.  If anything is abnormal we will treat accordingly and get you in for a follow-up.   Preventive Care 40-63 Years Old, Female Preventive care refers to visits with your health care provider and lifestyle choices that can promote health and wellness. This includes:  A yearly physical exam. This may also be called an annual well check.  Regular dental visits and eye exams.  Immunizations.  Screening for certain conditions.  Healthy lifestyle choices, such as eating a healthy diet, getting regular exercise, not using drugs or products that contain nicotine and tobacco, and limiting alcohol use. What can I expect for my preventive care visit? Physical exam Your health care provider will check your:  Height and weight. This may be used to calculate body mass index (BMI), which tells if you are at a healthy weight.  Heart rate and blood pressure.  Skin for abnormal spots. Counseling Your health care provider may ask you questions about your:  Alcohol, tobacco, and drug use.  Emotional well-being.  Home and relationship well-being.  Sexual activity.  Eating habits.  Work and work environment.  Method of birth control.  Menstrual cycle.  Pregnancy history. What immunizations do I need?  Influenza (flu) vaccine  This is recommended every year. Tetanus, diphtheria, and pertussis (Tdap) vaccine  You may need a Td booster every 10 years. Varicella (chickenpox) vaccine  You may need this if you have not been vaccinated. Zoster (shingles) vaccine  You may need this after age 63. Measles, mumps, and rubella (MMR) vaccine  You may need at least one dose of MMR if you were born in 1957 or later. You may also need a  second dose. Pneumococcal conjugate (PCV13) vaccine  You may need this if you have certain conditions and were not previously vaccinated. Pneumococcal polysaccharide (PPSV23) vaccine  You may need one or two doses if you smoke cigarettes or if you have certain conditions. Meningococcal conjugate (MenACWY) vaccine  You may need this if you have certain conditions. Hepatitis A vaccine  You may need this if you have certain conditions or if you travel or work in places where you may be exposed to hepatitis A. Hepatitis B vaccine  You may need this if you have certain conditions or if you travel or work in places where you may be exposed to hepatitis B. Haemophilus influenzae type b (Hib) vaccine  You may need this if you have certain conditions. Human papillomavirus (HPV) vaccine  If recommended by your health care provider, you may need three doses over 6 months. You may receive vaccines as individual doses or as more than one vaccine together in one shot (combination vaccines). Talk with your health care provider about the risks and benefits of combination vaccines. What tests do I need? Blood tests  Lipid and cholesterol levels. These may be checked every 5 years, or more frequently if you are over 50 years old.  Hepatitis C test.  Hepatitis B test. Screening  Lung cancer screening. You may have this screening every year starting at age 63 if you have a 30-pack-year history of smoking and currently smoke or have quit within the past 15 years.  Colorectal cancer screening. All adults should have this screening starting   at age 63 and continuing until age 11. Your health care provider may recommend screening at age 63 if you are at increased risk. You will have tests every 1-10 years, depending on your results and the type of screening test.  Diabetes screening. This is done by checking your blood sugar (glucose) after you have not eaten for a while (fasting). You may have this done  every 1-3 years.  Mammogram. This may be done every 1-2 years. Talk with your health care provider about when you should start having regular mammograms. This may depend on whether you have a family history of breast cancer.  BRCA-related cancer screening. This may be done if you have a family history of breast, ovarian, tubal, or peritoneal cancers.  Pelvic exam and Pap test. This may be done every 3 years starting at age 63. Starting at age 63, this may be done every 5 years if you have a Pap test in combination with an HPV test. Other tests  Sexually transmitted disease (STD) testing.  Bone density scan. This is done to screen for osteoporosis. You may have this scan if you are at high risk for osteoporosis. Follow these instructions at home: Eating and drinking  Eat a diet that includes fresh fruits and vegetables, whole grains, lean protein, and low-fat dairy.  Take vitamin and mineral supplements as recommended by your health care provider.  Do not drink alcohol if: ? Your health care provider tells you not to drink. ? You are pregnant, may be pregnant, or are planning to become pregnant.  If you drink alcohol: ? Limit how much you have to 0-1 drink a day. ? Be aware of how much alcohol is in your drink. In the [place] one drink equals one 12 oz bottle of beer (355 mL), one 5 oz glass of wine (148 mL), or one 1 oz glass of hard liquor (44 mL). Lifestyle  Take daily care of your teeth and gums.  Stay active. Exercise for at least 30 minutes on 5 or more days each week.  Do not use any products that contain nicotine or tobacco, such as cigarettes, e-cigarettes, and chewing tobacco. If you need help quitting, ask your health care provider.  If you are sexually active, practice safe sex. Use a condom or other form of birth control (contraception) in order to prevent pregnancy and STIs (sexually transmitted infections).  If told by your health care provider, take low-dose aspirin  daily starting at age 63. What's next?  Visit your health care provider once a year for a well check visit.  Ask your health care provider how often you should have your eyes and teeth checked.  Stay up to date on all vaccines. This information is not intended to replace advice given to you by your health care provider. Make sure you discuss any questions you have with your health care provider. Document Revised: 08/10/2018 Document Reviewed: 08/10/2018 Elsevier Patient Education  [employer]. .

## 2020-10-10 ENCOUNTER — Other Ambulatory Visit: Payer: Self-pay

## 2020-10-10 DIAGNOSIS — E785 Hyperlipidemia, unspecified: Secondary | ICD-10-CM

## 2020-12-10 ENCOUNTER — Other Ambulatory Visit: Payer: Self-pay | Admitting: Obstetrics and Gynecology

## 2020-12-10 DIAGNOSIS — Z1231 Encounter for screening mammogram for malignant neoplasm of breast: Secondary | ICD-10-CM

## 2021-01-05 ENCOUNTER — Encounter: Payer: Self-pay | Admitting: Physician Assistant

## 2021-01-08 ENCOUNTER — Ambulatory Visit: Payer: Self-pay

## 2021-01-08 ENCOUNTER — Other Ambulatory Visit (INDEPENDENT_AMBULATORY_CARE_PROVIDER_SITE_OTHER): Payer: 59

## 2021-01-08 ENCOUNTER — Ambulatory Visit: Payer: BC Managed Care – PPO

## 2021-01-08 ENCOUNTER — Other Ambulatory Visit: Payer: Self-pay

## 2021-01-08 ENCOUNTER — Encounter: Payer: Self-pay | Admitting: Physician Assistant

## 2021-01-08 DIAGNOSIS — E785 Hyperlipidemia, unspecified: Secondary | ICD-10-CM

## 2021-01-08 LAB — LIPID PANEL
Cholesterol: 216 mg/dL — ABNORMAL HIGH (ref 0–200)
HDL: 67.2 mg/dL (ref >39.00)
LDL Cholesterol: 127 mg/dL — ABNORMAL HIGH (ref 0–99)
NonHDL: 148.68
Total CHOL/HDL Ratio: 3
Triglycerides: 108 mg/dL (ref 0.0–149.0)
VLDL: 21.6 mg/dL (ref 0.0–40.0)

## 2021-01-15 LAB — HM PAP SMEAR

## 2021-01-27 ENCOUNTER — Ambulatory Visit: Payer: BC Managed Care – PPO

## 2021-02-11 ENCOUNTER — Other Ambulatory Visit: Payer: Self-pay

## 2021-02-11 DIAGNOSIS — I10 Essential (primary) hypertension: Secondary | ICD-10-CM

## 2021-02-11 MED ORDER — AMLODIPINE BESYLATE 5 MG PO TABS: ORAL_TABLET | ORAL | 0 refills | Status: DC

## 2021-02-18 ENCOUNTER — Telehealth: Payer: Self-pay | Admitting: Physician Assistant

## 2021-02-18 DIAGNOSIS — E785 Hyperlipidemia, unspecified: Secondary | ICD-10-CM

## 2021-02-18 DIAGNOSIS — I10 Essential (primary) hypertension: Secondary | ICD-10-CM

## 2021-02-18 MED ORDER — ATORVASTATIN CALCIUM 20 MG PO TABS: ORAL_TABLET | ORAL | 0 refills | Status: DC

## 2021-02-18 MED ORDER — AMLODIPINE BESYLATE 5 MG PO TABS: ORAL_TABLET | ORAL | 0 refills | Status: DC

## 2021-02-18 NOTE — Telephone Encounter (Signed)
..  Medication Refills  Last OV:  Medication:Amlodipine 5mg  and atoravastatin 20 mg.  Ph[facility]:  [facility] [address], [place] - [phone]    Let patient know to contact pharmacy at the end of the day to make sure medication is ready.   Please notify patient to allow 48-72 hours to process.  Encourage patient to contact the pharmacy for refills or they can request refills through [place] out below:   Last refill:  QTY:  Refill Date:    Other Comments:   Okay for refill?  Please advise.

## 2021-02-18 NOTE — Telephone Encounter (Signed)
Rx sent to the preferred patient pharmacy  

## 2021-03-03 ENCOUNTER — Ambulatory Visit
Admission: RE | Admit: 2021-03-03 | Discharge: 2021-03-03 | Disposition: A | Discharge status: Home / Self Care | Payer: 59 | Source: Ambulatory Visit | Attending: Obstetrics and Gynecology | Admitting: Obstetrics and Gynecology

## 2021-03-03 ENCOUNTER — Other Ambulatory Visit: Payer: Self-pay

## 2021-03-03 DIAGNOSIS — Z1231 Encounter for screening mammogram for malignant neoplasm of breast: Secondary | ICD-10-CM

## 2021-04-07 IMAGING — CT CT ABD-PELV W/ CM
1 of 3 series · 13 of 32 positions shown, 19 images · IV contrast (APPLIED)
Comparison: 05/31/2008

CLINICAL DATA: Left lower quadrant pain and tenderness for 1 week.

EXAM:
CT ABDOMEN AND PELVIS WITH CONTRAST
TECHNIQUE: Multidetector CT imaging of the abdomen and pelvis was performed
using the standard protocol following bolus administration of
intravenous contrast.
CONTRAST:  100mL [id number] IOPAMIDOL ([id number]) INJECTION 61%

[Series 2: abd/pelvis w/cm · axial · 0.76mm/px · z∈[-669,-259]mm · 13 of 96 slices shown, 19 images]
[im 7/96  soft-tissue]
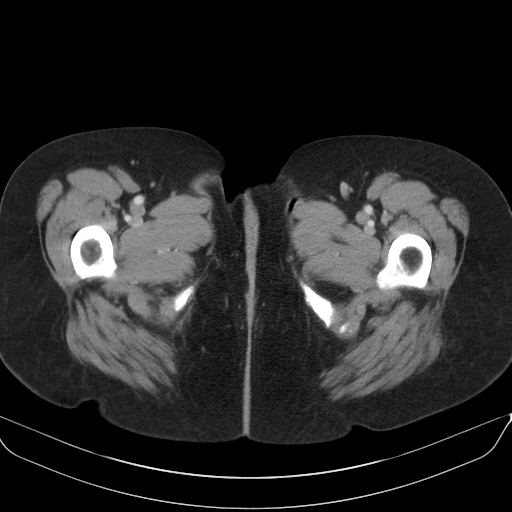
[im 7/96  bone]
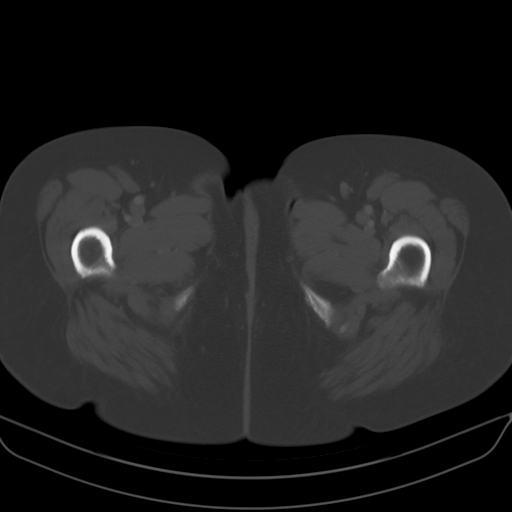
[im 13/96  soft-tissue]
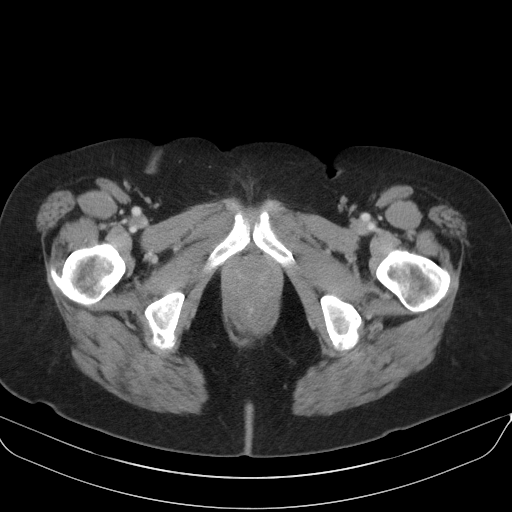
[im 20/96  soft-tissue]
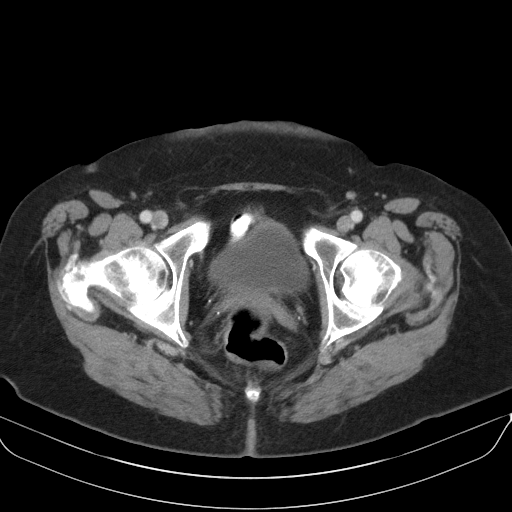
[im 26/96  soft-tissue]
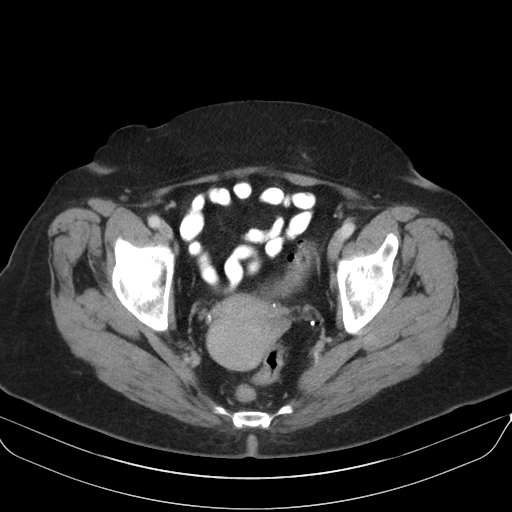
[im 32/96  soft-tissue]
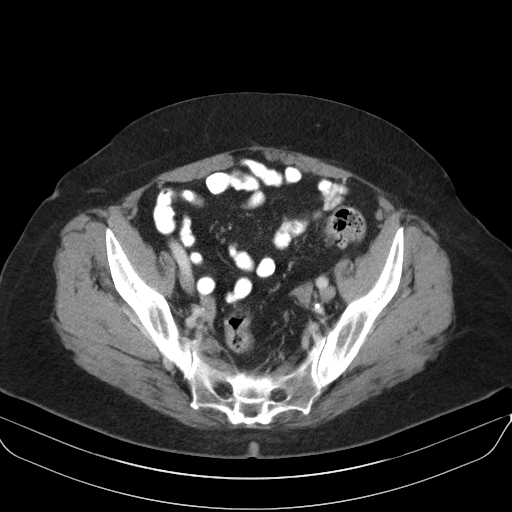
[im 39/96  soft-tissue]
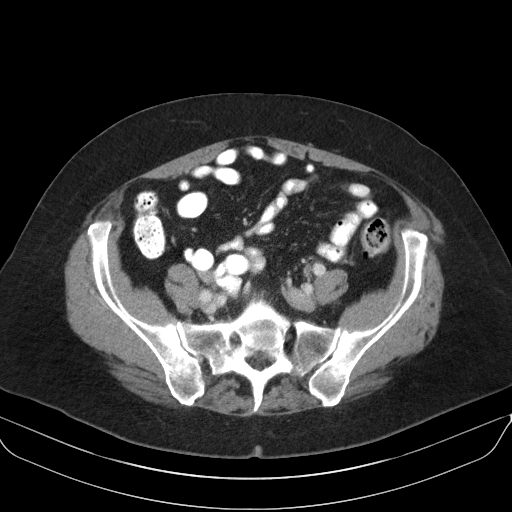
[im 51/96  soft-tissue]
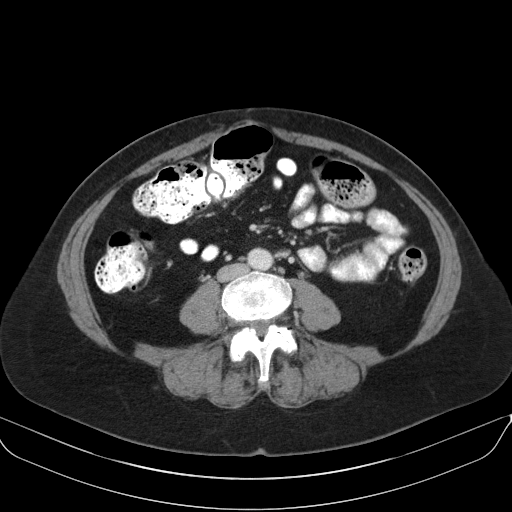
[im 58/96  soft-tissue]
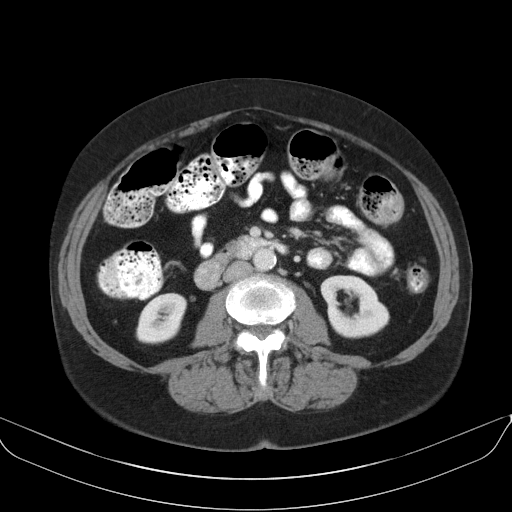
[im 64/96  soft-tissue]
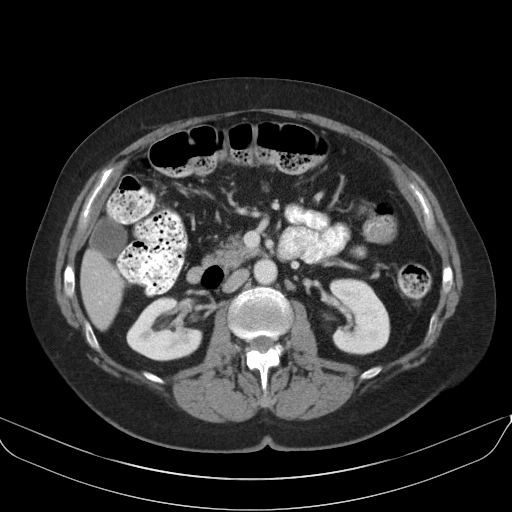
[im 64/96  bone]
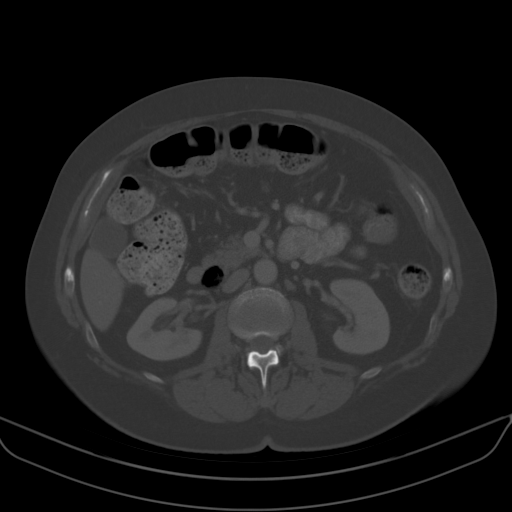
[im 70/96  soft-tissue]
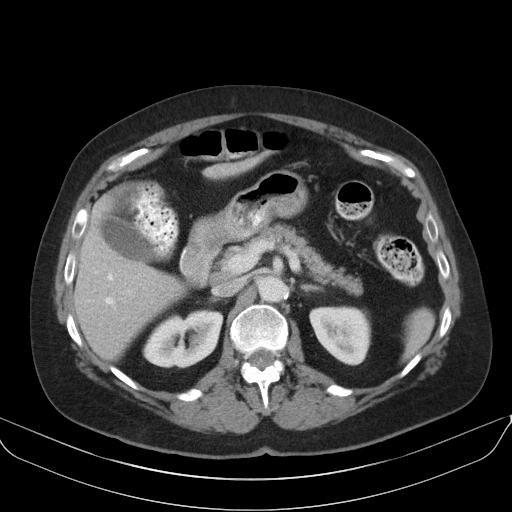
[im 70/96  lung]
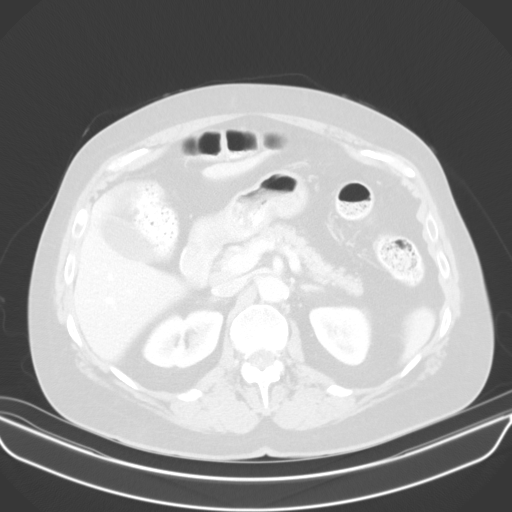
[im 77/96  soft-tissue]
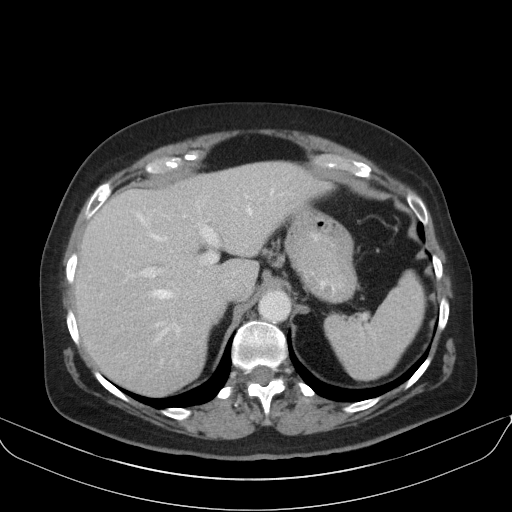
[im 77/96  lung]
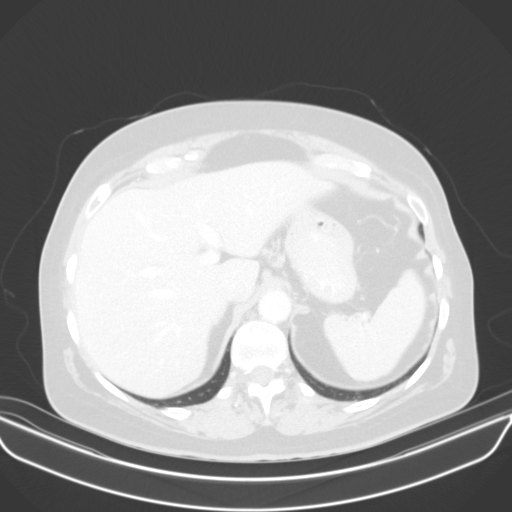
[im 83/96  soft-tissue]
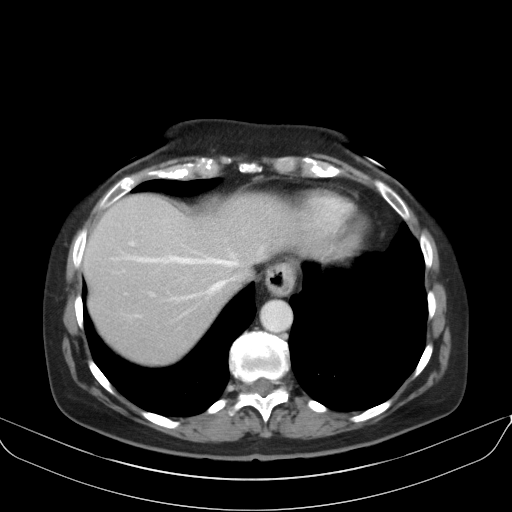
[im 83/96  lung]
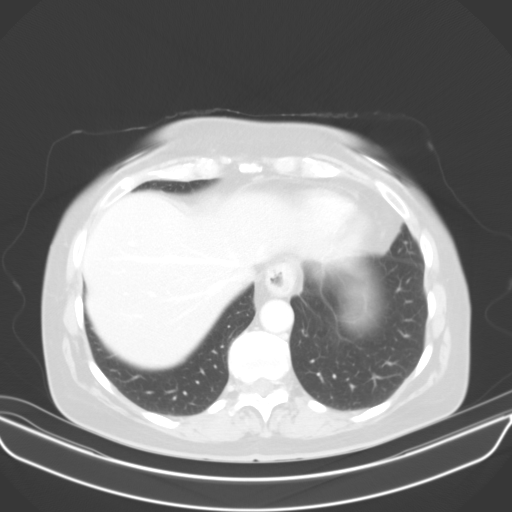
[im 89/96  soft-tissue]
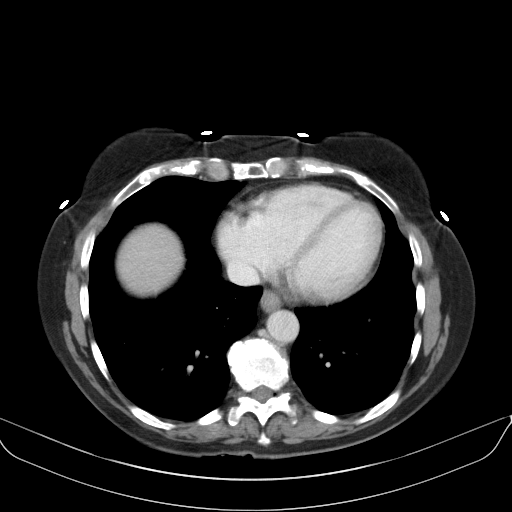
[im 89/96  lung]
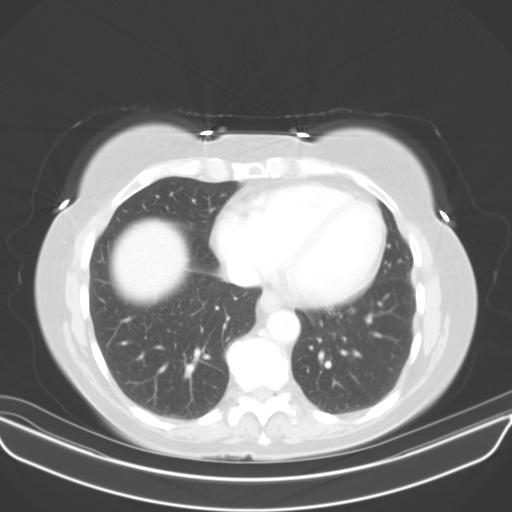

[13 of 32 positions shown; findings below may reference images not displayed]

FINDINGS: Lower Chest: No acute findings.

Hepatobiliary: No hepatic masses identified. Gallbladder is
unremarkable. No evidence of biliary ductal dilatation.

Pancreas:  No mass or inflammatory changes.

Spleen: Within normal limits in size and appearance.

Adrenals/Urinary Tract: No masses identified. No evidence of
ureteral calculi or hydronephrosis.

Stomach/Bowel: No evidence of obstruction, inflammatory process or
abnormal fluid collections. Diverticulosis is seen mainly involving
the descending and sigmoid colon, however there is no evidence of
diverticulitis.

Vascular/Lymphatic: No pathologically enlarged lymph nodes. No
abdominal aortic aneurysm.

Reproductive: Retroflexed uterus again noted. No mass or other
significant abnormality.

Other:  None.

Musculoskeletal:  No suspicious bone lesions identified.
IMPRESSION: Colonic diverticulosis, without radiographic evidence of
diverticulitis or other acute findings.

## 2021-05-13 ENCOUNTER — Other Ambulatory Visit: Payer: Self-pay | Admitting: Family

## 2021-05-13 DIAGNOSIS — I10 Essential (primary) hypertension: Secondary | ICD-10-CM

## 2021-05-13 DIAGNOSIS — E785 Hyperlipidemia, unspecified: Secondary | ICD-10-CM

## 2021-07-02 ENCOUNTER — Other Ambulatory Visit: Payer: Self-pay

## 2021-07-02 ENCOUNTER — Telehealth: Payer: Self-pay | Admitting: Physician Assistant

## 2021-07-02 DIAGNOSIS — I10 Essential (primary) hypertension: Secondary | ICD-10-CM

## 2021-07-02 DIAGNOSIS — E785 Hyperlipidemia, unspecified: Secondary | ICD-10-CM

## 2021-07-02 MED ORDER — ATORVASTATIN CALCIUM 20 MG PO TABS
ORAL_TABLET | ORAL | 0 refills | Status: DC
Start: 2021-07-02 — End: 2021-10-12

## 2021-07-02 MED ORDER — AMLODIPINE BESYLATE 5 MG PO TABS: ORAL_TABLET | ORAL | 0 refills | Status: DC

## 2021-07-02 NOTE — Telephone Encounter (Signed)
Request sent to NP morrow for approval.

## 2021-07-02 NOTE — Telephone Encounter (Signed)
Last OV:   Medication:  Amlodopine, and Atoravastan              Pharmacy:  [facility] [address] [place]   Let patient know to contact pharmacy at the end of the day to make sure medication is ready.    Please notify patient to allow 48-72 hours to process.   Encourage patient to contact the pharmacy for refills or they can request refills through [place] out below:  LFD for both meds 02/18/21 #90 by Dorothy Puffer. Patient informed additional refills should come from new provider. Patient is scheduled for 10/12/21 for [facility] with NP Orland Mustard. Please advise.

## 2021-07-02 NOTE — Telephone Encounter (Signed)
..  Medication Refills  Last OV:  Medication:  Amlodopine, and Atoravastan   Pharmacy:  [facility] [address], [place]  Let patient know to contact pharmacy at the end of the day to make sure medication is ready.   Please notify patient to allow 48-72 hours to process.  Encourage patient to contact the pharmacy for refills or they can request refills through [place] out below:   Last refill:  QTY:  Refill Date:    Other Comments:   Okay for refill?  Please advise.

## 2021-10-12 ENCOUNTER — Other Ambulatory Visit (INDEPENDENT_AMBULATORY_CARE_PROVIDER_SITE_OTHER): Payer: 59

## 2021-10-12 ENCOUNTER — Other Ambulatory Visit: Payer: Self-pay

## 2021-10-12 ENCOUNTER — Encounter: Payer: Self-pay | Admitting: Registered Nurse

## 2021-10-12 ENCOUNTER — Ambulatory Visit (INDEPENDENT_AMBULATORY_CARE_PROVIDER_SITE_OTHER): Payer: 59 | Admitting: Registered Nurse

## 2021-10-12 VITALS — BP 122/74 | HR 66 | Temp 98.1°F | Resp 18 | Ht 67.0 in | Wt 174.4 lb

## 2021-10-12 DIAGNOSIS — Z1329 Encounter for screening for other suspected endocrine disorder: Secondary | ICD-10-CM

## 2021-10-12 DIAGNOSIS — E559 Vitamin D deficiency, unspecified: Secondary | ICD-10-CM

## 2021-10-12 DIAGNOSIS — Z13228 Encounter for screening for other metabolic disorders: Secondary | ICD-10-CM

## 2021-10-12 DIAGNOSIS — Z0000 Encounter for general adult medical examination without abnormal findings: Secondary | ICD-10-CM | POA: Diagnosis not present

## 2021-10-12 DIAGNOSIS — Z13220 Encounter for screening for lipoid disorders: Secondary | ICD-10-CM

## 2021-10-12 DIAGNOSIS — Z130 Encounter for screening for diseases of the blood and blood-forming organs and certain disorders involving the immune mechanism: Secondary | ICD-10-CM

## 2021-10-12 DIAGNOSIS — E785 Hyperlipidemia, unspecified: Secondary | ICD-10-CM

## 2021-10-12 DIAGNOSIS — J45909 Unspecified asthma, uncomplicated: Secondary | ICD-10-CM

## 2021-10-12 DIAGNOSIS — I10 Essential (primary) hypertension: Secondary | ICD-10-CM

## 2021-10-12 LAB — CBC WITH DIFFERENTIAL/PLATELET
Basophils Absolute: 0.1 10*3/uL (ref 0.0–0.1)
Basophils Relative: 1.3 % (ref 0.0–3.0)
Eosinophils Absolute: 0.2 10*3/uL (ref 0.0–0.7)
Eosinophils Relative: 3.1 % (ref 0.0–5.0)
HCT: 42.3 % (ref 36.0–46.0)
Hemoglobin: 14.1 g/dL (ref 12.0–15.0)
Lymphocytes Relative: 22.3 % (ref 12.0–46.0)
Lymphs Abs: 1.5 10*3/uL (ref 0.7–4.0)
MCHC: 33.2 g/dL (ref 30.0–36.0)
MCV: 96.2 fl (ref 78.0–100.0)
Monocytes Absolute: 0.7 10*3/uL (ref 0.1–1.0)
Monocytes Relative: 9.9 % (ref 3.0–12.0)
Neutro Abs: 4.4 10*3/uL (ref 1.4–7.7)
Neutrophils Relative %: 63.4 % (ref 43.0–77.0)
Platelets: 324 10*3/uL (ref 150.0–400.0)
RBC: 4.4 Mil/uL (ref 3.87–5.11)
RDW: 13.4 % (ref 11.5–15.5)
WBC: 6.9 10*3/uL (ref 4.0–10.5)

## 2021-10-12 LAB — COMPREHENSIVE METABOLIC PANEL
ALT: 14 U/L (ref 0–35)
AST: 16 U/L (ref 0–37)
Albumin: 4.6 g/dL (ref 3.5–5.2)
Alkaline Phosphatase: 44 U/L (ref 39–117)
BUN: 10 mg/dL (ref 6–23)
CO2: 29 mEq/L (ref 19–32)
Calcium: 9.6 mg/dL (ref 8.4–10.5)
Chloride: 101 mEq/L (ref 96–112)
Creatinine, Ser: 0.79 mg/dL (ref 0.40–1.20)
GFR: 78.95 mL/min (ref >60.00)
Glucose, Bld: 87 mg/dL (ref 70–99)
Potassium: 4 mEq/L (ref 3.5–5.1)
Sodium: 139 mEq/L (ref 135–145)
Total Bilirubin: 0.7 mg/dL (ref 0.2–1.2)
Total Protein: 7.6 g/dL (ref 6.0–8.3)

## 2021-10-12 LAB — VITAMIN D 25 HYDROXY (VIT D DEFICIENCY, FRACTURES): VITD: 46.05 ng/mL (ref 30.00–100.00)

## 2021-10-12 LAB — LIPID PANEL
Cholesterol: 226 mg/dL — ABNORMAL HIGH (ref 0–200)
HDL: 68.9 mg/dL (ref >39.00)
LDL Cholesterol: 130 mg/dL — ABNORMAL HIGH (ref 0–99)
NonHDL: 156.82
Total CHOL/HDL Ratio: 3
Triglycerides: 136 mg/dL (ref 0.0–149.0)
VLDL: 27.2 mg/dL (ref 0.0–40.0)

## 2021-10-12 LAB — TSH: TSH: 3 u[IU]/mL (ref 0.35–5.50)

## 2021-10-12 LAB — T4, FREE: Free T4: 0.93 ng/dL (ref 0.60–1.60)

## 2021-10-12 LAB — HEMOGLOBIN A1C: Hgb A1c MFr Bld: 5.4 % (ref 4.6–6.5)

## 2021-10-12 MED ORDER — ALBUTEROL SULFATE HFA 108 (90 BASE) MCG/ACT IN AERS: 2.0000 | INHALATION_SPRAY | Freq: Four times a day (QID) | RESPIRATORY_TRACT | 2 refills | Status: DC | PRN

## 2021-10-12 MED ORDER — ATORVASTATIN CALCIUM 20 MG PO TABS: ORAL_TABLET | ORAL | 0 refills | Status: DC

## 2021-10-12 MED ORDER — AMLODIPINE BESYLATE 5 MG PO TABS: ORAL_TABLET | ORAL | 0 refills | Status: DC

## 2021-10-12 MED ORDER — BUDESONIDE-FORMOTEROL FUMARATE 80-4.5 MCG/ACT IN AERO: INHALATION_SPRAY | RESPIRATORY_TRACT | 2 refills | Status: DC

## 2021-10-12 NOTE — Patient Instructions (Signed)
Ms. Delong -   Doristine Devoid to meet you.  [facility]  [place] [id number]  No acute concerns on exam.  I'll let you know how labs look  Thanks,  Denice Paradise

## 2021-10-12 NOTE — Progress Notes (Signed)
Established Patient Office Visit  Subjective:  Patient ID: Tina Ryan, female    DOB: Apr 19, 1956  Age: 65 y.o. MRN: [id number]  CC:  Chief Complaint  Patient presents with   Transitions Of Care    Patient states she is here for a TOC. And CPE.    HPI Tina Ryan presents for visit to est care.  Histories reviewed and updated with patient.   No acute concerns.   Past Medical History:  Diagnosis Date   Allergy    seasonal & animal triggers   Asthma    Diverticulitis 2009 & 2011   Hx of adenomatous colonic polyps 2004 & 2007   Hyperlipidemia    Hypertension     Past Surgical History:  Procedure Laterality Date   ANTERIOR CRUCIATE LIGAMENT REPAIR  1995   APPENDECTOMY     BREAST BIOPSY  2005   colonoscopy with polypectomy     Dr Cristina Gong    Family History  Problem Relation Age of Onset   Colon cancer Father    Stroke Father    Hyperlipidemia Mother    Breast cancer Mother    Lung cancer Mother        2 lobectomies ; radiation   Heart attack Maternal Aunt 46   Heart attack Paternal Aunt 72       Bullous Pemphigoid; Hamman - Rich Syndrome   Hyperlipidemia Other        71 M aunts   Thyroid disease Other        76 M aunts   Hypertension Other        multiple M aunts    Breast cancer Other        58 M aunts   GER disease Son    Stroke Maternal Grandmother    Diabetes Neg Hx     Social History   Socioeconomic History   Marital status: Single    Spouse name: Not on file   Number of children: Not on file   Years of education: Not on file   Highest education level: Not on file  Occupational History   Not on file  Tobacco Use   Smoking status: Never   Smokeless tobacco: Never  Vaping Use   Vaping Use: Never used  Substance and Sexual Activity   Alcohol use: Not Currently    Alcohol/week: 5.0 standard drinks    Types: 5 Glasses of wine per week   Drug use: No   Sexual activity: Not Currently  Other Topics Concern   Not on file  Social History  Narrative   Not on file   Social Determinants of Health   Financial Resource Strain: Not on file  Food Insecurity: Not on file  Transportation Needs: Not on file  Physical Activity: Not on file  Stress: Not on file  Social Connections: Not on file  Intimate Partner Violence: Not on file    Outpatient Medications Prior to Visit  Medication Sig Dispense Refill   BIOTIN MAXIMUM PO Take by mouth.     cholecalciferol (VITAMIN D) 1000 units tablet Take 1,000 Units by mouth daily.     Multiple Vitamins-Minerals (MULTIVITAMIN GUMMIES ADULT PO) Take by mouth daily.     albuterol (PROVENTIL HFA;VENTOLIN HFA) 108 (90 BASE) MCG/ACT inhaler Inhale 2 puffs into the lungs every 6 (six) hours as needed for wheezing or shortness of breath. 1 Inhaler 2   amLODipine (NORVASC) 5 MG tablet TAKE 1 TABLET BY MOUTH EVERY DAY. Further refills will need  to come from new primary provider 90 tablet 0   atorvastatin (LIPITOR) 20 MG tablet TAKE 1 TABLET BY MOUTH EVERY DAY. Further refills will need to come from new primary provider 90 tablet 0   SYMBICORT 80-4.5 MCG/ACT inhaler INHALE TWO PUFFS BY MOUTH TWICE A DAY 10.2 g 2   No facility-administered medications prior to visit.    Allergies  Allergen Reactions   Codeine     Vomitting   Sulfa Antibiotics Hives   Lisinopril     cough    ROS Review of Systems  Constitutional: Negative.   HENT: Negative.    Eyes: Negative.   Respiratory: Negative.    Cardiovascular: Negative.   Gastrointestinal: Negative.   Genitourinary: Negative.   Musculoskeletal: Negative.   Skin: Negative.   Neurological: Negative.   Psychiatric/Behavioral: Negative.    All other systems reviewed and are negative.    Objective:    Physical Exam Vitals and nursing note reviewed.  Constitutional:      General: She is not in acute distress.    Appearance: Normal appearance. She is normal weight. She is not ill-appearing, toxic-appearing or diaphoretic.  HENT:     Head:  Normocephalic and atraumatic.     Right Ear: Tympanic membrane, ear canal and external ear normal. There is no impacted cerumen.     Left Ear: Tympanic membrane, ear canal and external ear normal. There is no impacted cerumen.     Nose: Nose normal. No congestion or rhinorrhea.     Mouth/Throat:     Mouth: Mucous membranes are moist.     Pharynx: Oropharynx is clear. No oropharyngeal exudate or posterior oropharyngeal erythema.  Eyes:     General: No scleral icterus.       Right eye: No discharge.        Left eye: No discharge.     Extraocular Movements: Extraocular movements intact.     Conjunctiva/sclera: Conjunctivae normal.     Pupils: Pupils are equal, round, and reactive to light.  Cardiovascular:     Rate and Rhythm: Normal rate and regular rhythm.     Pulses: Normal pulses.     Heart sounds: Normal heart sounds. No murmur heard.   No friction rub. No gallop.  Pulmonary:     Effort: Pulmonary effort is normal. No respiratory distress.     Breath sounds: Normal breath sounds. No stridor. No wheezing, rhonchi or rales.  Chest:     Chest wall: No tenderness.  Abdominal:     General: Abdomen is flat. Bowel sounds are normal. There is no distension.     Palpations: Abdomen is soft. There is no mass.     Tenderness: There is no abdominal tenderness. There is no right CVA tenderness, left CVA tenderness, guarding or rebound.     Hernia: No hernia is present.  Musculoskeletal:        General: No swelling, tenderness, deformity or signs of injury. Normal range of motion.     Right lower leg: No edema.     Left lower leg: No edema.  Skin:    General: Skin is warm and dry.     Capillary Refill: Capillary refill takes less than 2 seconds.     Coloration: Skin is not jaundiced or pale.     Findings: No bruising, erythema, lesion or rash.  Neurological:     General: No focal deficit present.     Mental Status: She is alert and oriented to person, place, and time. Mental  status is at  baseline.     Cranial Nerves: No cranial nerve deficit.     Sensory: No sensory deficit.     Motor: No weakness.     Coordination: Coordination normal.     Gait: Gait normal.     Deep Tendon Reflexes: Reflexes normal.  Psychiatric:        Mood and Affect: Mood normal.        Behavior: Behavior normal.        Thought Content: Thought content normal.        Judgment: Judgment normal.    BP 122/74   Pulse 66   Temp 98.1 F (36.7 C) (Temporal)   Resp 18   Ht 5\' 7"  (1.702 m)   Wt 174 lb 6.4 oz (79.1 kg)   SpO2 100%   BMI 27.31 kg/m  Wt Readings from Last 3 Encounters:  10/12/21 174 lb 6.4 oz (79.1 kg)  10/09/20 163 lb (73.9 kg)  04/23/20 169 lb 6.4 oz (76.8 kg)     Health Maintenance Due  Topic Date Due   Zoster Vaccines- Shingrix (1 of 2) Never done   COVID-19 Vaccine (4 - Booster for Pfizer series) 11/03/2020    There are no preventive care reminders to display for this patient.  Lab Results  Component Value Date   TSH 3.00 10/12/2021   Lab Results  Component Value Date   WBC 6.9 10/12/2021   HGB 14.1 10/12/2021   HCT 42.3 10/12/2021   MCV 96.2 10/12/2021   PLT 324.0 10/12/2021   Lab Results  Component Value Date   NA 139 10/12/2021   K 4.0 10/12/2021   CO2 29 10/12/2021   GLUCOSE 87 10/12/2021   BUN 10 10/12/2021   CREATININE 0.79 10/12/2021   BILITOT 0.7 10/12/2021   ALKPHOS 44 10/12/2021   AST 16 10/12/2021   ALT 14 10/12/2021   PROT 7.6 10/12/2021   ALBUMIN 4.6 10/12/2021   CALCIUM 9.6 10/12/2021   GFR 78.95 10/12/2021   Lab Results  Component Value Date   CHOL 226 (H) 10/12/2021   Lab Results  Component Value Date   HDL 68.90 10/12/2021   Lab Results  Component Value Date   LDLCALC 130 (H) 10/12/2021   Lab Results  Component Value Date   TRIG 136.0 10/12/2021   Lab Results  Component Value Date   CHOLHDL 3 10/12/2021   Lab Results  Component Value Date   HGBA1C 5.4 10/12/2021      Assessment & Plan:   Problem List Items  Addressed This Visit       Cardiovascular and Mediastinum   Essential hypertension   Relevant Medications   amLODipine (NORVASC) 5 MG tablet   atorvastatin (LIPITOR) 20 MG tablet     Other   Hyperlipidemia   Relevant Medications   amLODipine (NORVASC) 5 MG tablet   atorvastatin (LIPITOR) 20 MG tablet   Other Visit Diagnoses     Annual physical exam    -  Primary   Screening for endocrine, metabolic and immunity disorder       Relevant Orders   CBC with Differential/Platelet (Completed)   Comprehensive metabolic panel (Completed)   Hemoglobin A1c (Completed)   TSH (Completed)   T4, free (Completed)   Lipid screening       Relevant Orders   Lipid panel (Completed)   Vitamin D deficiency       Relevant Orders   Vitamin D (25 hydroxy) (Completed)   Asthma  Relevant Medications   budesonide-formoterol (SYMBICORT) 80-4.5 MCG/ACT inhaler   albuterol (VENTOLIN HFA) 108 (90 Base) MCG/ACT inhaler       Meds ordered this encounter  Medications   amLODipine (NORVASC) 5 MG tablet    Sig: TAKE 1 TABLET BY MOUTH EVERY DAY. Further refills will need to come from new primary provider    Dispense:  90 tablet    Refill:  0    Order Specific Question:   Supervising Provider    Answer:   Carlota Raspberry, JEFFREY R [[id number]]   atorvastatin (LIPITOR) 20 MG tablet    Sig: TAKE 1 TABLET BY MOUTH EVERY DAY. Further refills will need to come from new primary provider    Dispense:  90 tablet    Refill:  0    Order Specific Question:   Supervising Provider    Answer:   Carlota Raspberry, JEFFREY R [[id number]]   budesonide-formoterol (SYMBICORT) 80-4.5 MCG/ACT inhaler    Sig: INHALE TWO PUFFS BY MOUTH TWICE A DAY    Dispense:  10.2 g    Refill:  2    Order Specific Question:   Supervising Provider    Answer:   Carlota Raspberry, JEFFREY R [[id number]]   albuterol (VENTOLIN HFA) 108 (90 Base) MCG/ACT inhaler    Sig: Inhale 2 puffs into the lungs every 6 (six) hours as needed for wheezing or shortness of breath.    Dispense:   1 each    Refill:  2    Order Specific Question:   Supervising Provider    Answer:   Carlota Raspberry, JEFFREY R [[id number]]    Follow-up: Return in about 1 year (around 10/12/2022) for CPE and labs.   PLAN Exam unremarkable Labs collected. Will follow up with the patient as warranted. Chronic conditions stable, refill meds x 1 year Patient encouraged to call clinic with any questions, comments, or concerns.  Maximiano Coss, NP

## 2022-01-06 ENCOUNTER — Other Ambulatory Visit: Payer: Self-pay | Admitting: Registered Nurse

## 2022-01-06 DIAGNOSIS — E785 Hyperlipidemia, unspecified: Secondary | ICD-10-CM

## 2022-01-07 ENCOUNTER — Other Ambulatory Visit: Payer: Self-pay | Admitting: Registered Nurse

## 2022-01-07 DIAGNOSIS — I10 Essential (primary) hypertension: Secondary | ICD-10-CM

## 2022-01-18 ENCOUNTER — Other Ambulatory Visit: Payer: Self-pay | Admitting: Obstetrics and Gynecology

## 2022-01-18 DIAGNOSIS — Z1231 Encounter for screening mammogram for malignant neoplasm of breast: Secondary | ICD-10-CM

## 2022-01-20 ENCOUNTER — Other Ambulatory Visit: Payer: Self-pay | Admitting: Obstetrics and Gynecology

## 2022-01-20 DIAGNOSIS — E2839 Other primary ovarian failure: Secondary | ICD-10-CM

## 2022-03-04 ENCOUNTER — Ambulatory Visit: Payer: Self-pay

## 2022-03-25 ENCOUNTER — Ambulatory Visit
Admission: RE | Admit: 2022-03-25 | Discharge: 2022-03-25 | Disposition: A | Discharge status: Home / Self Care | Payer: Medicare HMO | Source: Ambulatory Visit | Attending: Obstetrics and Gynecology | Admitting: Obstetrics and Gynecology

## 2022-03-25 DIAGNOSIS — Z1231 Encounter for screening mammogram for malignant neoplasm of breast: Secondary | ICD-10-CM

## 2022-03-29 ENCOUNTER — Other Ambulatory Visit: Payer: Self-pay

## 2022-03-29 ENCOUNTER — Telehealth: Payer: Self-pay

## 2022-03-29 DIAGNOSIS — E785 Hyperlipidemia, unspecified: Secondary | ICD-10-CM

## 2022-03-29 DIAGNOSIS — I10 Essential (primary) hypertension: Secondary | ICD-10-CM

## 2022-03-29 MED ORDER — ATORVASTATIN CALCIUM 20 MG PO TABS: ORAL_TABLET | ORAL | 3 refills | Status: DC

## 2022-03-29 MED ORDER — AMLODIPINE BESYLATE 5 MG PO TABS: ORAL_TABLET | ORAL | 0 refills | Status: DC

## 2022-03-29 NOTE — Telephone Encounter (Signed)
Called patient about the medication refill. Patient is 9 days too early for amlodipine and should have 3 refills on Atorvastatin ?

## 2022-03-29 NOTE — Telephone Encounter (Signed)
MEDICATION: amLODipine (NORVASC) 5 MG tablet // atorvastatin (LIPITOR) 20 MG tablet ? ?PHARMACY: [facility]  [phone] ? ?Comments: Patient has a weeks left, wanted [place] to update her preferred pharmacy to be [facility].  ? ?**Let patient know to contact pharmacy at the end of the day to make sure medication is ready. ** ? ?** Please notify patient to allow 48-72 hours to process** ? ?**Encourage patient to contact the pharmacy for refills or they can request refills through [facility]** ? ? ?

## 2022-07-05 ENCOUNTER — Telehealth: Payer: Self-pay | Admitting: Registered Nurse

## 2022-07-05 NOTE — Telephone Encounter (Signed)
Patient wants to [facility] from [place] to Dr Birdie Riddle-  would like to stay in this office.

## 2022-07-07 NOTE — Telephone Encounter (Signed)
Unfortunately not able to accept at this time as many patients have the same wish and my panel is full.

## 2022-07-14 NOTE — Telephone Encounter (Signed)
Pt is aware and if she would like a female provider she should reach out to [facility]

## 2022-07-26 ENCOUNTER — Ambulatory Visit (INDEPENDENT_AMBULATORY_CARE_PROVIDER_SITE_OTHER): Payer: Medicare HMO | Admitting: Physician Assistant

## 2022-07-26 ENCOUNTER — Encounter: Payer: Self-pay | Admitting: Physician Assistant

## 2022-07-26 VITALS — BP 158/88 | HR 82 | Temp 98.1°F | Ht 67.0 in | Wt 170.2 lb

## 2022-07-26 DIAGNOSIS — F419 Anxiety disorder, unspecified: Secondary | ICD-10-CM

## 2022-07-26 DIAGNOSIS — Z23 Encounter for immunization: Secondary | ICD-10-CM

## 2022-07-26 DIAGNOSIS — R69 Illness, unspecified: Secondary | ICD-10-CM | POA: Diagnosis not present

## 2022-07-26 DIAGNOSIS — I10 Essential (primary) hypertension: Secondary | ICD-10-CM

## 2022-07-26 MED ORDER — AMLODIPINE BESYLATE 5 MG PO TABS: ORAL_TABLET | ORAL | 3 refills | Status: DC

## 2022-07-26 NOTE — Progress Notes (Signed)
Subjective:    Patient ID: Tina Ryan, female    DOB: Oct 15, 1956, 66 y.o.   MRN: [id number]  Chief Complaint  Patient presents with   Transfer of Care    Former Orland Mustard pt; pt says he's saw Dr Orland Mustard once for CPE; pt states is due this month for Colonoscopy; pt needs refills on medications; pt states not sleeping as well due to some stress    HPI 66 y.o. patient presents today for new patient establishment with me.  Patient was previously established with Maximiano Coss.  Current Care Team: GYN - Dr. Rogue Bussing   Acute Concerns: Needing medication refills Going through some stress with daughter in [place], mother of her 3 grandkids   Past Medical History:  Diagnosis Date   Allergy    seasonal & animal triggers   Asthma    Diverticulitis 2009 & 2011   Hx of adenomatous colonic polyps 2004 & 2007   Hyperlipidemia    Hypertension     Past Surgical History:  Procedure Laterality Date   ANTERIOR CRUCIATE LIGAMENT REPAIR  1995   APPENDECTOMY     BREAST BIOPSY  2005   colonoscopy with polypectomy     Dr Cristina Gong    Family History  Problem Relation Age of Onset   Colon cancer Father    Stroke Father    Hyperlipidemia Mother    Breast cancer Mother    Lung cancer Mother        2 lobectomies ; radiation   Heart attack Maternal Aunt 46   Heart attack Paternal Aunt 38       Bullous Pemphigoid; Hamman - Rich Syndrome   Hyperlipidemia Other        60 M aunts   Thyroid disease Other        53 M aunts   Hypertension Other        multiple M aunts    Breast cancer Other        32 M aunts   GER disease Son    Stroke Maternal Grandmother    Diabetes Neg Hx     Social History   Tobacco Use   Smoking status: Never   Smokeless tobacco: Never  Vaping Use   Vaping Use: Never used  Substance Use Topics   Alcohol use: Not Currently    Alcohol/week: 5.0 standard drinks of alcohol    Types: 5 Glasses of wine per week   Drug use: No     Allergies  Allergen Reactions    Codeine     Vomitting   Sulfa Antibiotics Hives   Lisinopril     cough    Review of Systems NEGATIVE UNLESS OTHERWISE INDICATED IN HPI      Objective:     BP (!) 158/88 (BP Location: Right Arm) Comment (BP Location): Manually  Pulse 82   Temp 98.1 F (36.7 C) (Temporal)   Ht '5\' 7"'$  (1.702 m)   Wt 170 lb 3.2 oz (77.2 kg)   SpO2 98%   BMI 26.66 kg/m   Wt Readings from Last 3 Encounters:  07/26/22 170 lb 3.2 oz (77.2 kg)  10/12/21 174 lb 6.4 oz (79.1 kg)  10/09/20 163 lb (73.9 kg)    BP Readings from Last 3 Encounters:  07/26/22 (!) 158/88  10/12/21 122/74  10/09/20 118/70     Physical Exam Constitutional:      Appearance: Normal appearance.  Eyes:     Extraocular Movements: Extraocular movements intact.  Conjunctiva/sclera: Conjunctivae normal.     Pupils: Pupils are equal, round, and reactive to light.  Cardiovascular:     Rate and Rhythm: Normal rate and regular rhythm.     Pulses: Normal pulses.     Heart sounds: Normal heart sounds.  Pulmonary:     Effort: Pulmonary effort is normal.     Breath sounds: Normal breath sounds.  Neurological:     Mental Status: She is alert.  Psychiatric:        Attention and Perception: Attention normal.        Mood and Affect: Mood is anxious.        Behavior: Behavior normal.        Assessment & Plan:   Problem List Items Addressed This Visit       Cardiovascular and Mediastinum   Essential hypertension - Primary   Relevant Medications   amLODipine (NORVASC) 5 MG tablet   Other Visit Diagnoses     Anxiety       Relevant Orders   Ambulatory referral to Psychology   Need for prophylactic vaccination against Streptococcus pneumoniae (pneumococcus)       Relevant Orders   Pneumococcal conjugate vaccine 20-valent (Prevnar 20) (Completed)        Meds ordered this encounter  Medications   amLODipine (NORVASC) 5 MG tablet    Sig: TAKE 1 TABLET BY MOUTH EVERY DAY.    Dispense:  90 tablet    Refill:   3    Order Specific Question:   Supervising Provider    Answer:   Yong Channel, STEPHEN O [[id number]]   1. Essential hypertension Elevated today - a bit worked up 2/2 stress right now Cont to monitor at home Amlodipine 5 mg daily Call if any changes  2. Anxiety Referral for counseling will be helpful Natural ways to reduce stress Recheck prn  3. Need for prophylactic vaccination against Streptococcus pneumoniae (pneumococcus) Updated today    Return in about 3 months (around 10/26/2022) for fasting labs and Welcome to Medicare physical.    Randa Evens Musette Kisamore, PA-C

## 2022-07-26 NOTE — Patient Instructions (Signed)
Welcome to [facility]! It was a pleasure meeting you today.  As discussed, Please schedule a 3 month follow up visit today.  PLEASE NOTE:  If you had any LAB tests please let us know if you have not heard back within a few days. You may see your results on MyChart before we have a chance to review them but we will give you a call once they are reviewed by [place]. If we ordered any REFERRALS today, please let us know if you have not heard from their office within the next two weeks. Let us know through MyChart if you are needing REFILLS, or have your pharmacy send [place] the request. You can also use MyChart to communicate with me or any office staff.  Please try these tips to maintain a healthy lifestyle:  Eat most of your calories during the day when you are active. Eliminate processed foods including packaged sweets (pies, cakes, cookies), reduce intake of potatoes, white bread, white pasta, and white rice. Look for whole grain options, oat flour or almond flour.  Each meal should contain half fruits/vegetables, one quarter protein, and one quarter carbs (no bigger than a computer mouse).  Cut down on sweet beverages. This includes juice, soda, and sweet tea. Also watch fruit intake, though this is a healthier sweet option, it still contains natural sugar! Limit to 3 servings daily.  Drink at least 1 glass of water with each meal and aim for at least 8 glasses (64 ounces) per day.  Exercise at least 150 minutes every week to the best of your ability.    Take Care,  Kimari Coudriet, PA-C

## 2022-08-09 ENCOUNTER — Ambulatory Visit
Admission: RE | Admit: 2022-08-09 | Discharge: 2022-08-09 | Disposition: A | Discharge status: Home / Self Care | Payer: Medicare HMO | Source: Ambulatory Visit | Attending: Obstetrics and Gynecology | Admitting: Obstetrics and Gynecology

## 2022-08-09 DIAGNOSIS — M85851 Other specified disorders of bone density and structure, right thigh: Secondary | ICD-10-CM | POA: Diagnosis not present

## 2022-08-09 DIAGNOSIS — E2839 Other primary ovarian failure: Secondary | ICD-10-CM

## 2022-08-09 DIAGNOSIS — Z780 Asymptomatic menopausal state: Secondary | ICD-10-CM | POA: Diagnosis not present

## 2022-08-12 ENCOUNTER — Ambulatory Visit (INDEPENDENT_AMBULATORY_CARE_PROVIDER_SITE_OTHER): Payer: Medicare HMO | Admitting: Psychology

## 2022-08-12 DIAGNOSIS — F411 Generalized anxiety disorder: Secondary | ICD-10-CM

## 2022-08-12 DIAGNOSIS — R69 Illness, unspecified: Secondary | ICD-10-CM | POA: Diagnosis not present

## 2022-08-12 NOTE — Progress Notes (Addendum)
[facility] Initial Adult Intake  Name: Tina Ryan Date: 08/12/2022 MRN: [id number] DOB: 06-21-1956 PCP: Tina Lathe, PA-C  Time spent:  Start: 12:00 PM End:  12:00 PM  Guardian/Payee:  Self  Paperwork requested: Yes   Today I met with Tina Ryan in person for face to face in office psychotherapy.   Reason for Visit /Presenting Problem:  Since 2020 her daughter moved to [place] and had a baby during [place].  Her daughter Tina Ryan's personality seems to have changed.  Her daughter had a falling out with her brother.  She helps her daughter a lot with the three children.  Tina Ryan's husband works from home but 2 or 3 times a year he travels to [place].  Tina Ryan is struggling with anxiety and depression as a result of the emotional turmoil created by her daughter Tina Ryan in the family.  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  Over the years she was surpassed by her younger sister.  Her brother had substance abuse problems.  He has been sober for three years.  She now c/o that they are all "excluding" her.   Mental Status Exam: Appearance:   Casual and Fairly Groomed     Behavior:  Appropriate  Motor:  Normal  Speech/Language:   NA  Affect:  Appropriate  Mood:  normal  Thought process:  normal  Thought content:    WNL  Sensory/Perceptual disturbances:    WNL  Orientation:  oriented to person, place, time/date, and situation  Attention:  Good  Concentration:  Good  Memory:  WNL  Fund of knowledge:   Good  Insight:    Good  Judgment:   Good  Impulse Control:  Good    Reported Symptoms:   PHQ 9 = 7 (Moderate Depression) pt endorsed feeling depressed and disrupted sleep (initial sleep) half the time in the past two weeks.  Her appetite was disrupted for a couple of weeks but it's better.  GAD 7 = 8 (Moderate Anxiety)  Feeling anxiety and can't stop worrying half the time.  Worrying about different things  nearly everyday.  Risk Assessment: Danger to Self:  No Self-injurious Behavior: No Danger to Others: No Duty to Warn:no Physical Aggression / Violence:No  Access to Firearms a concern: No  Gang Involvement:No   Substance Abuse History: Current substance abuse: No     Past Psychiatric History:   Previous psychological history is significant for depression Outpatient Providers: Brief couple's counseling before the divorce History of Psych Hospitalization: No  Psychological Testing:  n/a    Abuse History:  Victim of: No.,  n/a    Report needed: No. Victim of Neglect:No. Witness / Exposure to Domestic Violence: No   Protective Services Involvement: No  Witness to [employer] Violence:  No   Family History:  Family History  Problem Relation Age of Onset   Colon cancer Father    Stroke Father    Hyperlipidemia Mother    Breast cancer Mother    Lung cancer Mother        2 lobectomies ; radiation   Heart attack Maternal Aunt 46   Heart attack Paternal Aunt 47       Bullous Pemphigoid; Hamman - Rich Syndrome   Hyperlipidemia Other        5 M aunts   Thyroid disease Other        61 M aunts   Hypertension Other  multiple M aunts    Breast cancer Other        53 M aunts   GER disease Son    Stroke Maternal Grandmother    Diabetes Neg Hx     Living situation: the patient lives with their son  Sexual Orientation: Straight  Relationship Status: divorced  - Tina Ryan (71) he has COPD, he was Mudlogger for many years,  he was widowed Name of spouse / other: She was married to Gahanna for 19 years and are now divorced.   If a parent, number of children / ages:  Tina Ryan (39) her husband Tina Ryan (49) they have 3 children - Tina Ryan (10), Tina Ryan (8) and [place] "Tina Ryan" (2 3/4)  Tina Ryan (37) he lives in [place], he battled depression, social anxiety, substance abuse (Adderal, Clonipin, Xanax)   He would use alcohol to come down from his Adderal since he was in high school at [employer].   He went to rehab in [place] and then moved in with her while he continued to recover.  He is dating a gal who is a Product manager and "gets him."  He works at [employer].  He lost a lot of weight and is doing better.  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, travel nursing over [employer], recently broke up with her serious boyfriend  Support Systems: friends, adult Geophysicist/field seismologist Stress:  No   Income/Employment/Disability: Financial trader: No   Educational History: Education: college graduate - RN   Religion/Sprituality/World View: N/a  Any cultural differences that may affect / interfere with treatment:  not applicable   Recreation/Hobbies: cooking, watch her grandchildren  Stressors: Marital or family conflict    Strengths: Supportive Relationships, Family, and Friends  Barriers: none  Legal History: Pending legal issue / charges: The patient has no significant history of legal issues. History of legal issue / charges:  n/a  Medical History/Surgical History: reviewed Past Medical History:  Diagnosis Date   Allergy    seasonal & animal triggers   Asthma    Diverticulitis 2009 & 2011   Hx of adenomatous colonic polyps 2004 & 2007   Hyperlipidemia    Hypertension     Past Surgical History:  Procedure Laterality Date   [place]     BREAST BIOPSY  2005   colonoscopy with polypectomy     Dr Tina Ryan    Medications: Current Outpatient Medications  Medication Sig Dispense Refill   albuterol (VENTOLIN HFA) 108 (90 Base) MCG/ACT inhaler Inhale 2 puffs into the lungs every 6 (six) hours as needed for wheezing or shortness of breath. 1 each 2   amLODipine (NORVASC) 5 MG tablet TAKE 1 TABLET BY MOUTH EVERY DAY. 90 tablet 3   atorvastatin (LIPITOR) 20 MG tablet TAKE 1 TABLET BY MOUTH EVERY DAY. FURTHER REFILLS WILL NEED TO COME FROM NEW PRIMARY PROVIDER 90 tablet  3   BIOTIN MAXIMUM PO Take by mouth.     budesonide-formoterol (SYMBICORT) 80-4.5 MCG/ACT inhaler INHALE TWO PUFFS BY MOUTH TWICE A DAY 10.2 g 2   cholecalciferol (VITAMIN D) 1000 units tablet Take 1,000 Units by mouth daily.     Multiple Vitamins-Minerals (MULTIVITAMIN GUMMIES ADULT PO) Take by mouth daily.     No current facility-administered medications for this visit.    Allergies  Allergen Reactions   Codeine     Vomitting   Sulfa Antibiotics Hives   Lisinopril  cough    Diagnoses:  Generalized anxiety disorder  Plan of Care:  Pt would benefit from a weekly individual therapy to help address the difficult family dynamics which are cause her a great deal of stress.  The treatment would also focus on the management of her anxiety, setting boundaries and communication skills.   [name], PhD

## 2022-08-19 ENCOUNTER — Ambulatory Visit: Payer: Medicare HMO | Admitting: Psychology

## 2022-08-19 DIAGNOSIS — F411 Generalized anxiety disorder: Secondary | ICD-10-CM | POA: Diagnosis not present

## 2022-08-19 DIAGNOSIS — R69 Illness, unspecified: Secondary | ICD-10-CM | POA: Diagnosis not present

## 2022-08-19 NOTE — Progress Notes (Signed)
Progress Note:  Name: Malaysia Crance Date: 08/19/2022 MRN: [id number] DOB: Oct 28, 1956 PCP: Fredirick Lathe, PA-C  Time spent:  Start: 12:00 PM End:  12:00 PM  Today I met with [name] in person for face to face in office psychotherapy.   Reason for Visit /Presenting Problem:  Since 2020 her daughter moved to [place] and had a baby during [place].  Her daughter [name]'s personality seems to have changed.  Her daughter had a falling out with her brother.  She helps her daughter a lot with the three children.  [name]'s husband works from home but 2 or 3 times a year he travels to [place].  Aidan is struggling with anxiety and depression as a result of the emotional turmoil created by her daughter [name] in the family.  [name] was a Electrical engineer and was the [place] in the family for the longest time.  Over the years she was surpassed by her younger sister.  Her brother had substance abuse problems.  He has been sober for three years.  She now c/o that they are all "excluding" her.    Individualized Treatment Plan      Strengths: verbal, motivated, good listener  Supports: partner, friends   Goal/Needs for Treatment:  In order of importance to patient 1) Learn to cope with family dysfunction  2) Learn communications skills to facilitate communication  3) Learn and implement coping skills and strategies to manage anxiety   Client Statement of Needs: learn how to cope with family dysfunction, learn how to better communicate with her daughter, decease anxiety   Treatment Level: Weekly Outpatient Individual Psychotherapy  Symptoms:   PHQ 9 = 7 (Moderate Depression) pt endorsed feeling depressed and disrupted sleep (initial sleep) half the time in the past two weeks.  Her appetite was disrupted for a couple of weeks but it's better.   GAD 7 = 8 (Moderate Anxiety)  Feeling anxiety and can't stop worrying half the time.  Worrying about different things nearly everyday.     Client Treatment Preferences: in-person when possible    Healthcare consumer's goal for treatment:  Psychologist, [name], Ph.D  will support the patient's ability to achieve the goals identified. Cognitive Behavioral Therapy, Dialectical Behavioral Therapy, Motivational Interviewing, and other evidenced-based practices will be used to promote progress towards healthy functioning.   Healthcare consumer Sherine Cortese will: Actively participate in therapy, working towards healthy functioning.    *Justification for Continuation/Discontinuation of Goal: R=Revised, O=Ongoing, A=Achieved, D=Discontinued  Goal 1) Learn to cope with family dysfunction  5 Point Likert rating baseline date:  Target Date Goal Was reviewed Status Code Progress towards goal/Likert rating  08/20/2023                Goal 2) Learn communications skills to facilitate communication  5 Point Likert rating baseline date: Target Date Goal Was reviewed Status Code Progress towards goal/Likert rating  08/20/2023                Goal 3) Learn and implement coping skills and strategies to manage anxiety   5 Point Likert rating baseline date: Target Date Goal Was reviewed Status Code Progress towards goal/Likert rating  08/20/2023                This plan has been reviewed and created by the following participants:  This plan will be reviewed at least every 12 months. Date Behavioral Health Clinician Date Guardian/Patient   08/19/2022   08/19/2022  Diagnoses:  No diagnosis found.   In session today, Shekelia and I d/ treatment goals and created her individualized treatment plan.  Milcah freely participated in the creation of her treatment plan and gave her consent.   Mary Ann Garcia, PhD   _________________________________________________________  Notes:   John (72) he has COPD, he was their contractor for many years,  he was widowed  [name] (39) her husband Chris (49) they have 3  children - Marshall (10), Eliza (8) and [name] (2 3/4)  Reid (37) he lives in [place], he battled depression, social anxiety, substance abuse (Adderal, Clonipin, Xanax)   He would use alcohol to come down from his Adderal since he was in high school at [employer].  He went to rehab in [place] and then moved in with her while he continued to recover.  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  He lost a lot of weight and is doing better.  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 

## 2022-08-25 ENCOUNTER — Ambulatory Visit: Payer: Medicare HMO | Admitting: Psychology

## 2022-08-26 DIAGNOSIS — H524 Presbyopia: Secondary | ICD-10-CM | POA: Diagnosis not present

## 2022-08-30 DIAGNOSIS — Z0100 Encounter for examination of eyes and vision without abnormal findings: Secondary | ICD-10-CM | POA: Diagnosis not present

## 2022-09-02 ENCOUNTER — Ambulatory Visit: Payer: Medicare HMO | Admitting: Psychology

## 2022-09-02 DIAGNOSIS — F411 Generalized anxiety disorder: Secondary | ICD-10-CM | POA: Diagnosis not present

## 2022-09-02 DIAGNOSIS — R69 Illness, unspecified: Secondary | ICD-10-CM | POA: Diagnosis not present

## 2022-09-02 NOTE — Progress Notes (Signed)
PROGRESS NOTE:  Name: Tina Ryan Date: 09/02/2022 MRN: [id number] DOB: 07-16-1956 PCP: Tina Lathe, PA-C  Time spent:  Start: 1:00 PM End:  2:00 PM  Today I met with Tina Ryan in person for face to face in office psychotherapy.   Reason for Visit /Presenting Problem:  Since 2020 her daughter moved to [place] and had a baby during [place].  Her daughter Tina Ryan's personality seems to have changed.  Her daughter had a falling out with her brother.  She helps her daughter a lot with the three children.  Tina Ryan's husband works from home but 2 or 3 times a year he travels to [place].  Tina Ryan is struggling with anxiety and depression as a result of the emotional turmoil created by her daughter Tina Ryan in the family.  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  Over the years she was surpassed by her younger sister.  Her brother had substance abuse problems.  He has been sober for three years.  She now c/o that they are all "excluding" her.    Individualized Treatment Plan            Strengths: verbal, motivated, good listener  Supports: partner, friends   Goal/Needs for Treatment:  In order of importance to patient 1) Learn to cope with family dysfunction  2) Learn communications skills to facilitate communication  3) Learn and implement coping skills and strategies to manage anxiety   Client Statement of Needs: learn how to cope with family dysfunction, learn how to better communicate with her daughter, decease anxiety   Treatment Level: Weekly Outpatient Individual Psychotherapy  Symptoms:   PHQ 9 = 7 (Moderate Depression) pt endorsed feeling depressed and disrupted sleep (initial sleep) half the time in the past two weeks.  Her appetite was disrupted for a couple of weeks but it's better.   GAD 7 = 8 (Moderate Anxiety)  Feeling anxiety and can't stop worrying half the time.  Worrying about different things nearly everyday.     Client Treatment Preferences: in-person when possible    Healthcare consumer's goal for treatment:  Psychologist, Tina Ryan, Ph.D  will support the patient's ability to achieve the goals identified. Cognitive Behavioral Therapy, Dialectical Behavioral Therapy, Motivational Interviewing, and other evidenced-based practices will be used to promote progress towards healthy functioning.   Healthcare consumer Tina Ryan will: Actively participate in therapy, working towards healthy functioning.    *Justification for Continuation/Discontinuation of Goal: R=Revised, O=Ongoing, A=Achieved, D=Discontinued  Goal 1) Learn to cope with family dysfunction  5 Point Likert rating baseline date:  Target Date Goal Was reviewed Status Code Progress towards goal/Likert rating  08/20/2023                Goal 2) Learn communications skills to facilitate communication  5 Point Likert rating baseline date: Target Date Goal Was reviewed Status Code Progress towards goal/Likert rating  08/20/2023                Goal 3) Learn and implement coping skills and strategies to manage anxiety   5 Point Likert rating baseline date: Target Date Goal Was reviewed Status Code Progress towards goal/Likert rating  08/20/2023                This plan has been reviewed and created by the following participants:  This plan will be reviewed at least every 12 months. Date Behavioral Health Clinician Date Guardian/Patient  08/19/2022   08/19/2022                    Diagnoses:  Generalized anxiety disorder   Tina Ryan reports that she drove to her great-aunt's funeral.  She saw cousins she hasn't seen in years.  Arlene shared some special moments she had reminiscing with her family.  Serenna hasn't heard from her daughter in nine weeks.  We d/p that it has been difficult not reaching out to her.  We reviewed what occurred and reinforced her need to keep some distance.     Tina Crochet, PhD    _________________________________________________________  Notes:   Tina Ryan (72) he has COPD, he was their Chief Strategy Officer for many years, he was widowed  Tina Ryan (39) her husband Tina Ryan (94) they have 3 children - Tina Ryan (10), Tina Ryan (8) and Tina "Adi" (2 3/4)  Tina Ryan (37) he lives in [place], he battled depression, social anxiety, substance abuse (Adderal, Clonipin, Xanax)   He would use alcohol to come down from his Adderal since he was in high school at [employer].  He went to rehab in [place] and then moved in with her while he continued to recover.  He is dating a gal who is a Product manager and "gets him."  He works at [employer].  He lost a lot of weight and is doing better.  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

## 2022-09-08 ENCOUNTER — Ambulatory Visit: Payer: Medicare HMO | Admitting: Psychology

## 2022-09-14 DIAGNOSIS — M79644 Pain in right finger(s): Secondary | ICD-10-CM | POA: Diagnosis not present

## 2022-09-15 ENCOUNTER — Ambulatory Visit: Payer: Medicare HMO | Admitting: Psychology

## 2022-09-15 DIAGNOSIS — F411 Generalized anxiety disorder: Secondary | ICD-10-CM | POA: Diagnosis not present

## 2022-09-15 DIAGNOSIS — R69 Illness, unspecified: Secondary | ICD-10-CM | POA: Diagnosis not present

## 2022-09-15 NOTE — Progress Notes (Signed)
PROGRESS NOTE:  Name: Tina Ryan Date: 09/15/2022 MRN: [id number] DOB: Jun 12, 1956 PCP: Tina Lathe, PA-C  Time spent:  Start: 3:00 PM End:  3:55 PM  Today I met with Tina Ryan in person for face to face in office psychotherapy.   Reason for Visit /Presenting Problem:  Since 2020 her daughter moved to [place] and had a baby during [name].  Her daughter Tina Ryan's personality seems to have changed.  Her daughter had a falling out with her brother.  She helps her daughter a lot with the three children.  Tina Ryan's husband works from home but 2 or 3 times a year he travels to [place].  Tina Ryan is struggling with anxiety and depression as a result of the emotional turmoil created by her daughter Tina Ryan in the family.  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  Over the years she was surpassed by her younger sister.  Her brother had substance abuse problems.  He has been sober for three years.  She now c/o that they are all "excluding" her.    Individualized Treatment Plan                       Strengths: verbal, motivated, good listener  Supports: partner, friends   Goal/Needs for Treatment:  In order of importance to patient 1) Learn to cope with family dysfunction  2) Learn communications skills to facilitate communication  3) Learn and implement coping skills and strategies to manage anxiety   Client Statement of Needs: learn how to cope with family dysfunction, learn how to better communicate with her daughter, decease anxiety   Treatment Level: Weekly Outpatient Individual Psychotherapy  Symptoms:   PHQ 9 = 7 (Moderate Depression) pt endorsed feeling depressed and disrupted sleep (initial sleep) half the time in the past two weeks.  Her appetite was disrupted for a couple of weeks but it's better.   GAD 7 = 8 (Moderate Anxiety)  Feeling anxiety and can't stop worrying half the time.  Worrying about different things nearly  everyday.    Client Treatment Preferences: in-person when possible    Healthcare consumer's goal for treatment:  Psychologist, Tina Ryan, Ph.D  will support the patient's ability to achieve the goals identified. Cognitive Behavioral Therapy, Dialectical Behavioral Therapy, Motivational Interviewing, and other evidenced-based practices will be used to promote progress towards healthy functioning.   Healthcare consumer Tina Ryan will: Actively participate in therapy, working towards healthy functioning.    *Justification for Continuation/Discontinuation of Goal: R=Revised, O=Ongoing, A=Achieved, D=Discontinued  Goal 1) Learn to cope with family dysfunction  5 Point Likert rating baseline date:  Target Date Goal Was reviewed Status Code Progress towards goal/Likert rating  08/20/2023                Goal 2) Learn communications skills to facilitate communication  5 Point Likert rating baseline date: Target Date Goal Was reviewed Status Code Progress towards goal/Likert rating  08/20/2023                Goal 3) Learn and implement coping skills and strategies to manage anxiety   5 Point Likert rating baseline date: Target Date Goal Was reviewed Status Code Progress towards goal/Likert rating  08/20/2023                This plan has been reviewed and created by the following participants:  This plan will be reviewed at least every  12 months. Date Behavioral Health Clinician Date Guardian/Patient   08/19/2022 Tina Ryan, Ph.D   08/19/2022 Tina Ryan                   Diagnoses:  Generalized anxiety disorder   [name] reports that her daughter still hasn't talked to her.  She is sad and worried as she anticipates the approaching holidays.  I provided psycho education about personality disorders, personality dynamics and applied this information to what she is dealing with.  We were then able to d/p and made connections between the past and the present.     Tina Ryan states that she is trying to improve her sleep hygiene.  Her initial sleeping is better.  However, she is still have some sleep disruption and wakes up with a busy/anxious thoughts.  I provide some suggestions for improving her sleep hygiene and how to manage disrupted sleep episodes.   Tina Crochet, PhD   _________________________________________________________  Notes:   Tina Ryan (72) he has COPD, he was their Chief Strategy Officer for many years, he was widowed  Tina Ryan (39) her husband Tina Ryan (74) they have 3 children - Tina Ryan (10), Tina Ryan (8) and Tina "Adi" (2 3/4)  Tina Ryan (37) he lives in [place], he battled depression, social anxiety, substance abuse (Adderal, Clonipin, Xanax)   He would use alcohol to come down from his Adderal since he was in high school at [employer].  He went to rehab in [place] and then moved in with her while he continued to recover.  He is dating a gal who is a Product manager and "gets him."  He works at [employer].  He lost a lot of weight and is doing better.  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

## 2022-09-22 ENCOUNTER — Ambulatory Visit: Payer: Medicare HMO | Admitting: Psychology

## 2022-09-29 ENCOUNTER — Ambulatory Visit: Payer: Medicare HMO | Admitting: Psychology

## 2022-09-29 DIAGNOSIS — F411 Generalized anxiety disorder: Secondary | ICD-10-CM

## 2022-09-29 DIAGNOSIS — R69 Illness, unspecified: Secondary | ICD-10-CM | POA: Diagnosis not present

## 2022-09-29 NOTE — Progress Notes (Signed)
PROGRESS NOTE:  Name: Tina Ryan Date: 09/29/2022 MRN: [id number] DOB: 07-07-1956 PCP: Fredirick Lathe, PA-C  Time spent:  Start: 3:00 PM End:  3:55 PM  Today I met with Tina Ryan in person for face to face in office psychotherapy.   Reason for Visit /Presenting Problem:  Since 2020 her daughter moved to [place] and had a baby during [place].  Her daughter Tina Ryan's personality seems to have changed.  Her daughter had a falling out with her brother.  She helps her daughter a lot with the three children.  Tina Ryan's husband works from home but 2 or 3 times a year he travels to [place].  Tina Ryan is struggling with anxiety and depression as a result of the emotional turmoil created by her daughter Tina Ryan in the family.  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  Over the years she was surpassed by her younger sister.  Her brother had substance abuse problems.  He has been sober for three years.  She now c/o that they are all "excluding" her.    Individualized Treatment Plan                       Strengths: verbal, motivated, good listener  Supports: partner, friends   Goal/Needs for Treatment:  In order of importance to patient 1) Learn to cope with family dysfunction  2) Learn communications skills to facilitate communication  3) Learn and implement coping skills and strategies to manage anxiety   Client Statement of Needs: learn how to cope with family dysfunction, learn how to better communicate with her daughter, decease anxiety   Treatment Level: Weekly Outpatient Individual Psychotherapy  Symptoms:   PHQ 9 = 7 (Moderate Depression) pt endorsed feeling depressed and disrupted sleep (initial sleep) half the time in the past two weeks.  Her appetite was disrupted for a couple of weeks but it's better.   GAD 7 = 8 (Moderate Anxiety)  Feeling anxiety and can't stop worrying half the time.  Worrying about different things nearly  everyday.    Client Treatment Preferences: in-person when possible    Healthcare consumer's goal for treatment:  Psychologist, [name], Ph.D  will support the patient's ability to achieve the goals identified. Cognitive Behavioral Therapy, Dialectical Behavioral Therapy, Motivational Interviewing, and other evidenced-based practices will be used to promote progress towards healthy functioning.   Healthcare consumer Hena Ewalt will: Actively participate in therapy, working towards healthy functioning.    *Justification for Continuation/Discontinuation of Goal: R=Revised, O=Ongoing, A=Achieved, D=Discontinued  Goal 1) Learn to cope with family dysfunction  5 Point Likert rating baseline date:  Target Date Goal Was reviewed Status Code Progress towards goal/Likert rating  08/20/2023                Goal 2) Learn communications skills to facilitate communication  5 Point Likert rating baseline date: Target Date Goal Was reviewed Status Code Progress towards goal/Likert rating  08/20/2023                Goal 3) Learn and implement coping skills and strategies to manage anxiety   5 Point Likert rating baseline date: Target Date Goal Was reviewed Status Code Progress towards goal/Likert rating  08/20/2023                This plan has been reviewed and created by the following participants:  This plan will be reviewed at least every  12 months. Date Behavioral Health Clinician Date Guardian/Patient   08/19/2022 [name], Ph.D   08/19/2022 Tina Ryan                   Diagnoses:  Generalized anxiety disorder   Pierrette reports that she had an incident with her groomer a couple of days.  Her dog was injured but wasn't told.  She knows she has to talk to her and ask her to make reparation.  We d/p and came up with a script  and she practiced in session.   [name], PhD   _________________________________________________________  Notes:   Jenny Reichmann (72)  he has COPD, he was their Chief Strategy Officer for many years, he was widowed  Tina Ryan (39) her husband Tina Ryan (07) they have 3 children - Tina Ryan (10), Tina Ryan (8) and Tina "Adi" (2 3/4)  Tina Ryan (37) he lives in [place], he battled depression, social anxiety, substance abuse (Adderal, Clonipin, Xanax)   He would use alcohol to come down from his Adderal since he was in high school at [employer].  He went to rehab in [place] and then moved in with her while he continued to recover.  He is dating a gal who is a Product manager and "gets him."  He works at [employer].  He lost a lot of weight and is doing better.  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

## 2022-10-06 ENCOUNTER — Ambulatory Visit: Payer: Medicare HMO | Admitting: Psychology

## 2022-10-11 ENCOUNTER — Encounter: Payer: Self-pay | Admitting: Physician Assistant

## 2022-10-11 ENCOUNTER — Ambulatory Visit (INDEPENDENT_AMBULATORY_CARE_PROVIDER_SITE_OTHER): Payer: Medicare HMO | Admitting: Physician Assistant

## 2022-10-11 VITALS — BP 132/80 | HR 85 | Temp 97.7°F | Ht 67.0 in | Wt 169.0 lb

## 2022-10-11 DIAGNOSIS — H9193 Unspecified hearing loss, bilateral: Secondary | ICD-10-CM

## 2022-10-11 DIAGNOSIS — M8580 Other specified disorders of bone density and structure, unspecified site: Secondary | ICD-10-CM

## 2022-10-11 DIAGNOSIS — E785 Hyperlipidemia, unspecified: Secondary | ICD-10-CM

## 2022-10-11 DIAGNOSIS — I10 Essential (primary) hypertension: Secondary | ICD-10-CM

## 2022-10-11 DIAGNOSIS — Z0000 Encounter for general adult medical examination without abnormal findings: Secondary | ICD-10-CM

## 2022-10-11 LAB — CBC WITH DIFFERENTIAL/PLATELET
Basophils Absolute: 0.1 10*3/uL (ref 0.0–0.1)
Basophils Relative: 0.8 % (ref 0.0–3.0)
Eosinophils Absolute: 0.3 10*3/uL (ref 0.0–0.7)
Eosinophils Relative: 4 % (ref 0.0–5.0)
HCT: 41.7 % (ref 36.0–46.0)
Hemoglobin: 13.8 g/dL (ref 12.0–15.0)
Lymphocytes Relative: 16.4 % (ref 12.0–46.0)
Lymphs Abs: 1.1 10*3/uL (ref 0.7–4.0)
MCHC: 33.1 g/dL (ref 30.0–36.0)
MCV: 97.7 fl (ref 78.0–100.0)
Monocytes Absolute: 0.7 10*3/uL (ref 0.1–1.0)
Monocytes Relative: 10.1 % (ref 3.0–12.0)
Neutro Abs: 4.6 10*3/uL (ref 1.4–7.7)
Neutrophils Relative %: 68.7 % (ref 43.0–77.0)
Platelets: 325 10*3/uL (ref 150.0–400.0)
RBC: 4.27 Mil/uL (ref 3.87–5.11)
RDW: 13.4 % (ref 11.5–15.5)
WBC: 6.7 10*3/uL (ref 4.0–10.5)

## 2022-10-11 LAB — LIPID PANEL
Cholesterol: 250 mg/dL — ABNORMAL HIGH (ref 0–200)
HDL: 83.6 mg/dL (ref >39.00)
LDL Cholesterol: 135 mg/dL — ABNORMAL HIGH (ref 0–99)
NonHDL: 165.91
Total CHOL/HDL Ratio: 3
Triglycerides: 153 mg/dL — ABNORMAL HIGH (ref 0.0–149.0)
VLDL: 30.6 mg/dL (ref 0.0–40.0)

## 2022-10-11 LAB — COMPREHENSIVE METABOLIC PANEL
ALT: 19 U/L (ref 0–35)
AST: 18 U/L (ref 0–37)
Albumin: 4.6 g/dL (ref 3.5–5.2)
Alkaline Phosphatase: 52 U/L (ref 39–117)
BUN: 11 mg/dL (ref 6–23)
CO2: 28 mEq/L (ref 19–32)
Calcium: 9.8 mg/dL (ref 8.4–10.5)
Chloride: 100 mEq/L (ref 96–112)
Creatinine, Ser: 0.7 mg/dL (ref 0.40–1.20)
GFR: 90.65 mL/min (ref >60.00)
Glucose, Bld: 100 mg/dL — ABNORMAL HIGH (ref 70–99)
Potassium: 4.1 mEq/L (ref 3.5–5.1)
Sodium: 138 mEq/L (ref 135–145)
Total Bilirubin: 0.8 mg/dL (ref 0.2–1.2)
Total Protein: 7.3 g/dL (ref 6.0–8.3)

## 2022-10-11 LAB — HM AWV

## 2022-10-11 NOTE — Assessment & Plan Note (Signed)
Noted from bone density Pt taking Vit D and calcium citrate Work on adding strength training 2-3 times weekly

## 2022-10-11 NOTE — Assessment & Plan Note (Signed)
BP is stable - to goal, Cont amlodipine 5 mg daily

## 2022-10-11 NOTE — Progress Notes (Signed)
Chief Complaint:  Tina Ryan is a 65 y.o. female who presents today for a Medicare Initial Preventive Examination.     Subjective:  HPI:  Interim hx: She has been seeing a counselor since last visit and feeling much better. She had a bone density done on 08/09/22 - low bone density Appt scheduled for colonoscopy consult this year.  Vision is UTD as of September. Shingrix is scheduled for just prior to November.   Activities of Daily Living: He has no difficulty performing the following: Preparing food and eating Bathing  Getting dressed Using the toilet Shopping Managing Finances Moving around from place to place  Fall Screening: She has not had 0 falls within the past year.   Hearing Screening: Patient has no concerns over hearing.  Safety Screening Patient feels safe at home.  There are no smokers at home.   Depression Screening:    07/26/2022   10:08 AM  Depression screen PHQ 2/9  Decreased Interest 0  Down, Depressed, Hopeless 0  PHQ - 2 Score 0    Lifestyle Factors: Diet: Well-balanced, lost 60 lbs in 2020 and kept weight off; intermittent fasting  Exercise: Walking daily   Patient Care Team: Stevi Hollinshead, Randa Evens, PA-C as PCP - General (Physician Assistant) Allyn Kenner, DO as Consulting Physician (Obstetrics and Gynecology)   ROS: All systems reviewed and negative   PMH:  The following were reviewed and entered/updated in epic: Past Medical History:  Diagnosis Date   Allergy    seasonal & animal triggers   Asthma    Diverticulitis 2009 & 2011   Hx of adenomatous colonic polyps 2004 & 2007   Hyperlipidemia    Hypertension    Past Surgical History:  Procedure Laterality Date   [place]     BREAST BIOPSY  2005   colonoscopy with polypectomy     Dr Cristina Gong   Family History  Problem Relation Age of Onset   Colon cancer Father    Stroke Father    Hyperlipidemia Mother    Breast  cancer Mother    Lung cancer Mother        2 lobectomies ; radiation   Heart attack Maternal Aunt 46   Heart attack Paternal Aunt 15       Bullous Pemphigoid; Hamman - Rich Syndrome   Hyperlipidemia Other        83 M aunts   Thyroid disease Other        10 M aunts   Hypertension Other        multiple M aunts    Breast cancer Other        10 M aunts   GER disease Son    Stroke Maternal Grandmother    Diabetes Neg Hx     Medications- reviewed and updated Current Outpatient Medications  Medication Sig Dispense Refill   albuterol (VENTOLIN HFA) 108 (90 Base) MCG/ACT inhaler Inhale 2 puffs into the lungs every 6 (six) hours as needed for wheezing or shortness of breath. 1 each 2   amLODipine (NORVASC) 5 MG tablet TAKE 1 TABLET BY MOUTH EVERY DAY. 90 tablet 3   atorvastatin (LIPITOR) 20 MG tablet TAKE 1 TABLET BY MOUTH EVERY DAY. FURTHER REFILLS WILL NEED TO COME FROM NEW PRIMARY PROVIDER 90 tablet 3   BIOTIN MAXIMUM PO Take by mouth.     budesonide-formoterol (SYMBICORT) 80-4.5 MCG/ACT inhaler INHALE TWO PUFFS BY MOUTH TWICE A DAY 10.2 g 2  cholecalciferol (VITAMIN D) 1000 units tablet Take 1,000 Units by mouth daily.     Multiple Vitamins-Minerals (MULTIVITAMIN GUMMIES ADULT PO) Take by mouth daily.     No current facility-administered medications for this visit.    Allergies-reviewed and updated Allergies  Allergen Reactions   Codeine     Vomitting   Sulfa Antibiotics Hives   Lisinopril     cough    Social History   Socioeconomic History   Marital status: Single    Spouse name: Not on file   Number of children: Not on file   Years of education: Not on file   Highest education level: Not on file  Occupational History   Not on file  Tobacco Use   Smoking status: Never   Smokeless tobacco: Never  Vaping Use   Vaping Use: Never used  Substance and Sexual Activity   Alcohol use: Not Currently    Alcohol/week: 5.0 standard drinks of alcohol    Types: 5 Glasses of  wine per week   Drug use: No   Sexual activity: Not Currently  Other Topics Concern   Not on file  Social History Narrative   Not on file   Social Determinants of Health   Financial Resource Strain: Not on file  Food Insecurity: Not on file  Transportation Needs: Not on file  Physical Activity: Not on file  Stress: Not on file  Social Connections: Not on file         Objective/Observations  Physical Exam: BP 132/80 (BP Location: Left Arm)   Pulse 85   Temp 97.7 F (36.5 C) (Temporal)   Ht '5\' 7"'$  (1.702 m)   Wt 169 lb (76.7 kg)   SpO2 99%   BMI 26.47 kg/m  Passed Vision screening / Failed Hearing Screening bilateral ears     07/26/2022   10:07 AM 10/12/2021    2:36 PM 10/09/2019    9:36 AM 06/07/2017    2:28 PM 01/23/2016    9:53 AM  Fall Risk   Falls in the past year? 0 0 0 No No  Number falls in past yr: 0 0 0    Injury with Fall? 0 0 0    Risk for fall due to : No Fall Risks No Fall Risks     Follow up Falls evaluation completed Falls evaluation completed Falls evaluation completed     No results found. Gen: NAD, resting comfortably, wearing glasses  CV: RRR with no murmurs appreciated Pulm: NWOB, CTAB with no crackles, wheezes, or rhonchi GI: Normal bowel sounds present. Soft, Nontender, Nondistended. MSK: no edema, cyanosis, or clubbing noted Skin: warm, dry Neuro: grossly normal, moves all extremities Psych: Normal affect and thought content. Normal minicog.       Assessment/Plan:  New/Acute Problems: Decreased hearing on test today - will refer to audiology   Chronic Problems Addressed Today: Essential hypertension BP is stable - to goal, Cont amlodipine 5 mg daily   Hyperlipidemia Recheck lipid panel today Cont Lipitor 20 mg daily   Osteopenia Noted from bone density Pt taking Vit D and calcium citrate Work on adding strength training 2-3 times weekly    Preventative Healthcare Health Maintenance  Topic Date Due   Medicare Annual  Wellness (AWV)  Never done   COVID-19 Vaccine (4 - [place] series) 10/27/2022 (Originally 11/03/2020)   COLONOSCOPY (Pts 45-52yr Insurance coverage will need to be confirmed)  11/11/2022 (Originally 08/08/2022)   Zoster Vaccines- Shingrix (2 of 2) 10/26/2022   MAMMOGRAM  03/25/2024   PAP SMEAR-Modifier  01/19/2025   TETANUS/TDAP  01/21/2025   Pneumonia Vaccine 85+ Years old  Completed   INFLUENZA VACCINE  Completed   DEXA SCAN  Completed   Hepatitis C Screening  Completed   HIV Screening  Completed   HPV VACCINES  Aged Out   A screening EKG was performed today.  Mild sinus tachycardia noted at 102 bpm, otherwise normal EKG.  Patient Counseling(The following topics were reviewed and/or handout was given):  -Nutrition: Stressed importance of moderation in sodium/caffeine intake, saturated fat and cholesterol, caloric balance, sufficient intake of fresh fruits, vegetables, and fiber.  -Stressed the importance of regular exercise.   -Substance Abuse: Discussed cessation/primary prevention of tobacco, alcohol, or other drug use; driving or other dangerous activities under the influence; availability of treatment for abuse.   -Injury prevention: Discussed safety belts, safety helmets, smoke detector, smoking near bedding or upholstery.   -Dental health: Discussed importance of regular tooth brushing, flossing, and dental visits.  -Health maintenance and immunizations reviewed. Please refer to Health maintenance section.  Advance Directives were discussed with the patient.   Patient Instructions (the written plan) was given to the patient.  Medicare Attestation I have personally reviewed: The patient's medical and social history Their use of alcohol, tobacco or illicit drugs Their current medications and supplements The patient's functional ability including ADLs,fall risks, home safety risks, cognitive, and hearing and visual impairment Diet and physical activities Evidence for depression  or mood disorders   The patient's weight, height, BMI, and visual acuity have been recorded in the chart.  I have made referrals, counseling, and provided education to the patient based on review of the above and I have provided the patient with a written personalized care plan for preventive services.    During the course of the visit the patient was educated and counseled about appropriate screening and preventive services including:        Pneumococcal vaccine  Influenza vaccine Td vaccine Screening electrocardiogram Screening mammography Bone densitometry screening Colorectal cancer screening    Fedra Lanter M. Finnleigh Marchetti, PA-C

## 2022-10-11 NOTE — Assessment & Plan Note (Signed)
Recheck lipid panel today Cont Lipitor 20 mg daily

## 2022-10-13 ENCOUNTER — Ambulatory Visit: Payer: 59 | Admitting: Registered Nurse

## 2022-10-13 ENCOUNTER — Ambulatory Visit: Payer: Medicare HMO | Admitting: Psychology

## 2022-10-13 DIAGNOSIS — F411 Generalized anxiety disorder: Secondary | ICD-10-CM | POA: Diagnosis not present

## 2022-10-13 DIAGNOSIS — R69 Illness, unspecified: Secondary | ICD-10-CM | POA: Diagnosis not present

## 2022-10-13 NOTE — Progress Notes (Signed)
PROGRESS NOTE:  Name: Tina Ryan Date: 10/13/2022 MRN: [id number] DOB: Apr 12, 1956 PCP: Fredirick Lathe, PA-C  Time spent:  Start: 3:00 PM End:  3:55 PM  Today I met with Tina Ryan in person for face to face in office psychotherapy.   Reason for Visit /Presenting Problem:  Since 2020 her daughter moved to [place] and had a baby during [place].  Her daughter [name]'s personality seems to have changed.  Her daughter had a falling out with her brother.  She helps her daughter a lot with the three children.  [name]'s husband works from home but 2 or 3 times a year he travels to [place].  Venida is struggling with anxiety and depression as a result of the emotional turmoil created by her daughter [name] in the family.  [name] was a Electrical engineer and was the [place] in the family for the longest time.  Over the years she was surpassed by her younger sister.  Her brother had substance abuse problems.  He has been sober for three years.  She now c/o that they are all "excluding" her.    Individualized Treatment Plan                       Strengths: verbal, motivated, good listener  Supports: partner, friends   Goal/Needs for Treatment:  In order of importance to patient 1) Learn to cope with family dysfunction  2) Learn communications skills to facilitate communication  3) Learn and implement coping skills and strategies to manage anxiety   Client Statement of Needs: learn how to cope with family dysfunction, learn how to better communicate with her daughter, decease anxiety   Treatment Level: Weekly Outpatient Individual Psychotherapy  Symptoms:   PHQ 9 = 7 (Moderate Depression) pt endorsed feeling depressed and disrupted sleep (initial sleep) half the time in the past two weeks.  Her appetite was disrupted for a couple of weeks but it's better.   GAD 7 = 8 (Moderate Anxiety)  Feeling anxiety and can't stop worrying half the time.  Worrying about different things nearly  everyday.    Client Treatment Preferences: in-person when possible    Healthcare consumer's goal for treatment:  Psychologist, [name], Ph.D  will support the patient's ability to achieve the goals identified. Cognitive Behavioral Therapy, Dialectical Behavioral Therapy, Motivational Interviewing, and other evidenced-based practices will be used to promote progress towards healthy functioning.   Healthcare consumer Leightyn Cina will: Actively participate in therapy, working towards healthy functioning.    *Justification for Continuation/Discontinuation of Goal: R=Revised, O=Ongoing, A=Achieved, D=Discontinued  Goal 1) Learn to cope with family dysfunction  5 Point Likert rating baseline date:  Target Date Goal Was reviewed Status Code Progress towards goal/Likert rating  08/20/2023                Goal 2) Learn communications skills to facilitate communication  5 Point Likert rating baseline date: Target Date Goal Was reviewed Status Code Progress towards goal/Likert rating  08/20/2023                Goal 3) Learn and implement coping skills and strategies to manage anxiety   5 Point Likert rating baseline date: Target Date Goal Was reviewed Status Code Progress towards goal/Likert rating  08/20/2023                This plan has been reviewed and created by the following participants:  This plan will be reviewed at least every  12 months. Date Behavioral Health Clinician Date Guardian/Patient   08/19/2022 [name], Ph.D   08/19/2022 Tina Ryan                   Diagnoses:  Generalized anxiety disorder   Cidney reports that a week ago she heard from her daughter with whom she has been estranged.  She shared what was said, how everyone responded and steps going forward.  I encouraged Leeasia to consider in advance of their d/ three things she needs to communicate to re-set expectations and boundaries going forward.  We d/e some points but Tida agreed to  give it some more thought.    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  He went to rehab in [place] and then moved in with her while he continued to recover.  He is dating a gal who is a Product manager and "gets him."  He works at [employer].  He lost a lot of weight and is doing better.  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

## 2022-10-20 ENCOUNTER — Ambulatory Visit: Payer: Medicare HMO | Admitting: Psychology

## 2022-10-27 ENCOUNTER — Ambulatory Visit: Payer: Medicare HMO | Admitting: Psychology

## 2022-10-27 DIAGNOSIS — F411 Generalized anxiety disorder: Secondary | ICD-10-CM

## 2022-10-27 DIAGNOSIS — R69 Illness, unspecified: Secondary | ICD-10-CM | POA: Diagnosis not present

## 2022-10-27 NOTE — Progress Notes (Signed)
PROGRESS NOTE:  Name: Tina Ryan Date: 10/27/2022 MRN: [id number] DOB: 1956-04-23 PCP: Tina Lathe, PA-C  Time spent:  Start: 3:00 PM End:  3:55 PM  Today I met with Tina Ryan in person for face to face in office psychotherapy.   Reason for Visit /Presenting Problem:  Since 2020 her daughter moved to [place] and had a baby during [place].  Her daughter Tina Ryan's personality seems to have changed.  Her daughter had a falling out with her brother.  She helps her daughter a lot with the three children.  Tina Ryan's husband works from home but 2 or 3 times a year he travels to [place].  Tina Ryan is struggling with anxiety and depression as a result of the emotional turmoil created by her daughter Tina Ryan in the family.  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  Over the years she was surpassed by her younger sister.  Her brother had substance abuse problems.  He has been sober for three years.  She now c/o that they are all "excluding" her.    Individualized Treatment Plan                       Strengths: verbal, motivated, good listener  Supports: partner, friends   Goal/Needs for Treatment:  In order of importance to patient 1) Learn to cope with family dysfunction  2) Learn communications skills to facilitate communication  3) Learn and implement coping skills and strategies to manage anxiety   Client Statement of Needs: learn how to cope with family dysfunction, learn how to better communicate with her daughter, decease anxiety   Treatment Level: Weekly Outpatient Individual Psychotherapy  Symptoms:   PHQ 9 = 7 (Moderate Depression) pt endorsed feeling depressed and disrupted sleep (initial sleep) half the time in the past two weeks.  Her appetite was disrupted for a couple of weeks but it's better.   GAD 7 = 8 (Moderate Anxiety)  Feeling anxiety and can't stop worrying half the time.  Worrying about different things nearly  everyday.    Client Treatment Preferences: in-person when possible    Healthcare consumer's goal for treatment:  Psychologist, Tina Ryan, Ph.D  will support the patient's ability to achieve the goals identified. Cognitive Behavioral Therapy, Dialectical Behavioral Therapy, Motivational Interviewing, and other evidenced-based practices will be used to promote progress towards healthy functioning.   Healthcare consumer Tina Ryan will: Actively participate in therapy, working towards healthy functioning.    *Justification for Continuation/Discontinuation of Goal: R=Revised, O=Ongoing, A=Achieved, D=Discontinued  Goal 1) Learn to cope with family dysfunction  5 Point Likert rating baseline date:  Target Date Goal Was reviewed Status Code Progress towards goal/Likert rating  08/20/2023                Goal 2) Learn communications skills to facilitate communication  5 Point Likert rating baseline date: Target Date Goal Was reviewed Status Code Progress towards goal/Likert rating  08/20/2023                Goal 3) Learn and implement coping skills and strategies to manage anxiety   5 Point Likert rating baseline date: Target Date Goal Was reviewed Status Code Progress towards goal/Likert rating  08/20/2023                This plan has been reviewed and created by the following participants:  This plan will be reviewed at least every  12 months. Date Behavioral Health Clinician Date Guardian/Patient   08/19/2022 Tina Ryan, Ph.D   08/19/2022 Tina Ryan                   Diagnoses:  Generalized anxiety disorder   [name] reports that her daughter has not reached out since her last message.  We d/e/p the up coming holiday, her daughter's surgery and moving forward.     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  He went to rehab in [place] and then moved in with her while he continued to recover.  He is dating a gal who is a Product manager and "gets him."  He works at [employer].  He lost a lot of weight and is doing better.  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

## 2022-11-03 ENCOUNTER — Ambulatory Visit: Payer: Medicare HMO | Admitting: Psychology

## 2022-11-17 ENCOUNTER — Ambulatory Visit: Payer: Medicare HMO | Admitting: Psychology

## 2022-11-17 DIAGNOSIS — F411 Generalized anxiety disorder: Secondary | ICD-10-CM

## 2022-11-17 DIAGNOSIS — R69 Illness, unspecified: Secondary | ICD-10-CM | POA: Diagnosis not present

## 2022-11-17 NOTE — Progress Notes (Signed)
PROGRESS NOTE:  Name: Kari Montero Date: 11/17/2022 MRN: [id number] DOB: Mar 09, 1956 PCP: Fredirick Lathe, PA-C  Time spent:  Start: 3:00 PM End:  3:55 PM  Today I met with [name] in person for face to face in office psychotherapy.   Reason for Visit /Presenting Problem:  Since 2020 her daughter moved to [place] and had a baby during [place].  Her daughter [name]'s personality seems to have changed.  Her daughter had a falling out with her brother.  She helps her daughter a lot with the three children.  [name]'s husband works from home but 2 or 3 times a year he travels to [place].  Elissia is struggling with anxiety and depression as a result of the emotional turmoil created by her daughter [name] in the family.  [name] was a Electrical engineer and was the [place] in the family for the longest time.  Over the years she was surpassed by her younger sister.  Her brother had substance abuse problems.  He has been sober for three years.  She now c/o that they are all "excluding" her.    Individualized Treatment Plan                       Strengths: verbal, motivated, good listener  Supports: partner, friends   Goal/Needs for Treatment:  In order of importance to patient 1) Learn to cope with family dysfunction  2) Learn communications skills to facilitate communication  3) Learn and implement coping skills and strategies to manage anxiety   Client Statement of Needs: learn how to cope with family dysfunction, learn how to better communicate with her daughter, decease anxiety   Treatment Level: Weekly Outpatient Individual Psychotherapy  Symptoms:   PHQ 9 = 7 (Moderate Depression) pt endorsed feeling depressed and disrupted sleep (initial sleep) half the time in the past two weeks.  Her appetite was disrupted for a couple of weeks but it's better.   GAD 7 = 8 (Moderate Anxiety)  Feeling anxiety and can't stop worrying half the time.  Worrying about different things nearly  everyday.    Client Treatment Preferences: in-person when possible    Healthcare consumer's goal for treatment:  Psychologist, [name], Ph.D  will support the patient's ability to achieve the goals identified. Cognitive Behavioral Therapy, Dialectical Behavioral Therapy, Motivational Interviewing, and other evidenced-based practices will be used to promote progress towards healthy functioning.   Healthcare consumer Rakel Junio will: Actively participate in therapy, working towards healthy functioning.    *Justification for Continuation/Discontinuation of Goal: R=Revised, O=Ongoing, A=Achieved, D=Discontinued  Goal 1) Learn to cope with family dysfunction  5 Point Likert rating baseline date:  Target Date Goal Was reviewed Status Code Progress towards goal/Likert rating  08/20/2023                Goal 2) Learn communications skills to facilitate communication  5 Point Likert rating baseline date: Target Date Goal Was reviewed Status Code Progress towards goal/Likert rating  08/20/2023                Goal 3) Learn and implement coping skills and strategies to manage anxiety   5 Point Likert rating baseline date: Target Date Goal Was reviewed Status Code Progress towards goal/Likert rating  08/20/2023                This plan has been reviewed and created by the following participants:  This plan will be reviewed at least  every 12 months. Date Behavioral Health Clinician Date Guardian/Patient   08/19/2022 [name], Ph.D   08/19/2022 [name]                   Diagnoses:  Generalized anxiety disorder   Jolea reports that they had a nice Thanksgiving holiday together.  Unfortunately, [name] and his girlfriend came down with COVID.  Jule was able to manage to stay well.  Her daughter refused her offer to take the kids for the weekend.  We d/p a number of text communications with her estranged daughter and other incidents.    Leo hasn't felt like  decorating for the holidays.  We d/p that it's okay to keep things minimal and low stress this holiday season.     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  He went to rehab in [place] and then moved in with her while he continued to recover.  He is dating a gal who is a Product manager and "gets him."  He works at [employer].  He lost a lot of weight and is doing better.  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

## 2022-12-01 ENCOUNTER — Ambulatory Visit: Payer: Medicare HMO | Admitting: Psychology

## 2022-12-03 DIAGNOSIS — D123 Benign neoplasm of transverse colon: Secondary | ICD-10-CM | POA: Diagnosis not present

## 2022-12-03 DIAGNOSIS — D127 Benign neoplasm of rectosigmoid junction: Secondary | ICD-10-CM | POA: Diagnosis not present

## 2022-12-03 DIAGNOSIS — K5730 Diverticulosis of large intestine without perforation or abscess without bleeding: Secondary | ICD-10-CM | POA: Diagnosis not present

## 2022-12-03 DIAGNOSIS — Z800 Family history of malignant neoplasm of digestive organs: Secondary | ICD-10-CM | POA: Diagnosis not present

## 2022-12-03 DIAGNOSIS — D175 Benign lipomatous neoplasm of intra-abdominal organs: Secondary | ICD-10-CM | POA: Diagnosis not present

## 2022-12-03 DIAGNOSIS — K640 First degree hemorrhoids: Secondary | ICD-10-CM | POA: Diagnosis not present

## 2022-12-03 DIAGNOSIS — Z86010 Personal history of colonic polyps: Secondary | ICD-10-CM | POA: Diagnosis not present

## 2022-12-03 DIAGNOSIS — Z09 Encounter for follow-up examination after completed treatment for conditions other than malignant neoplasm: Secondary | ICD-10-CM | POA: Diagnosis not present

## 2022-12-03 LAB — HM COLONOSCOPY

## 2022-12-10 ENCOUNTER — Encounter: Payer: Self-pay | Admitting: Physician Assistant

## 2022-12-10 DIAGNOSIS — D127 Benign neoplasm of rectosigmoid junction: Secondary | ICD-10-CM | POA: Diagnosis not present

## 2022-12-10 DIAGNOSIS — D123 Benign neoplasm of transverse colon: Secondary | ICD-10-CM | POA: Diagnosis not present

## 2022-12-14 ENCOUNTER — Encounter: Payer: Self-pay | Admitting: Physician Assistant

## 2022-12-15 ENCOUNTER — Ambulatory Visit: Payer: Medicare HMO | Admitting: Psychology

## 2022-12-29 ENCOUNTER — Ambulatory Visit: Payer: Medicare HMO | Admitting: Psychology

## 2023-01-12 ENCOUNTER — Ambulatory Visit: Payer: Medicare HMO | Admitting: Psychology

## 2023-01-24 DIAGNOSIS — Z01419 Encounter for gynecological examination (general) (routine) without abnormal findings: Secondary | ICD-10-CM | POA: Diagnosis not present

## 2023-02-06 ENCOUNTER — Encounter: Payer: Self-pay | Admitting: Physician Assistant

## 2023-02-07 ENCOUNTER — Other Ambulatory Visit: Payer: Self-pay | Admitting: Physician Assistant

## 2023-02-07 DIAGNOSIS — E785 Hyperlipidemia, unspecified: Secondary | ICD-10-CM

## 2023-02-07 DIAGNOSIS — I10 Essential (primary) hypertension: Secondary | ICD-10-CM

## 2023-02-07 MED ORDER — ATORVASTATIN CALCIUM 20 MG PO TABS: ORAL_TABLET | ORAL | 3 refills | Status: DC

## 2023-02-07 MED ORDER — AMLODIPINE BESYLATE 5 MG PO TABS: ORAL_TABLET | ORAL | 3 refills | Status: DC

## 2023-02-07 NOTE — Telephone Encounter (Signed)
Please see pt msg and advise 

## 2023-02-14 ENCOUNTER — Other Ambulatory Visit: Payer: Self-pay | Admitting: Obstetrics and Gynecology

## 2023-02-14 DIAGNOSIS — Z139 Encounter for screening, unspecified: Secondary | ICD-10-CM

## 2023-03-28 ENCOUNTER — Ambulatory Visit
Admission: RE | Admit: 2023-03-28 | Discharge: 2023-03-28 | Disposition: A | Discharge status: Home / Self Care | Payer: Medicare HMO | Source: Ambulatory Visit | Attending: Obstetrics and Gynecology | Admitting: Obstetrics and Gynecology

## 2023-03-28 DIAGNOSIS — Z139 Encounter for screening, unspecified: Secondary | ICD-10-CM

## 2023-03-28 DIAGNOSIS — Z1231 Encounter for screening mammogram for malignant neoplasm of breast: Secondary | ICD-10-CM | POA: Diagnosis not present

## 2023-05-15 DIAGNOSIS — Z0100 Encounter for examination of eyes and vision without abnormal findings: Secondary | ICD-10-CM | POA: Diagnosis not present

## 2023-08-31 DIAGNOSIS — L820 Inflamed seborrheic keratosis: Secondary | ICD-10-CM | POA: Diagnosis not present

## 2023-08-31 DIAGNOSIS — L821 Other seborrheic keratosis: Secondary | ICD-10-CM | POA: Diagnosis not present

## 2023-09-21 DIAGNOSIS — H524 Presbyopia: Secondary | ICD-10-CM | POA: Diagnosis not present

## 2023-10-19 ENCOUNTER — Encounter: Payer: Self-pay | Admitting: Physician Assistant

## 2023-10-19 ENCOUNTER — Ambulatory Visit: Payer: Medicare HMO | Admitting: Physician Assistant

## 2023-10-19 VITALS — BP 133/89 | HR 78 | Temp 97.3°F | Ht 67.0 in | Wt 177.2 lb

## 2023-10-19 DIAGNOSIS — M8580 Other specified disorders of bone density and structure, unspecified site: Secondary | ICD-10-CM

## 2023-10-19 DIAGNOSIS — E785 Hyperlipidemia, unspecified: Secondary | ICD-10-CM

## 2023-10-19 DIAGNOSIS — K5730 Diverticulosis of large intestine without perforation or abscess without bleeding: Secondary | ICD-10-CM

## 2023-10-19 DIAGNOSIS — Z0000 Encounter for general adult medical examination without abnormal findings: Secondary | ICD-10-CM

## 2023-10-19 DIAGNOSIS — I10 Essential (primary) hypertension: Secondary | ICD-10-CM

## 2023-10-19 DIAGNOSIS — Z131 Encounter for screening for diabetes mellitus: Secondary | ICD-10-CM | POA: Diagnosis not present

## 2023-10-19 LAB — CBC WITH DIFFERENTIAL/PLATELET
Basophils Absolute: 0.1 10*3/uL (ref 0.0–0.1)
Basophils Relative: 0.8 % (ref 0.0–3.0)
Eosinophils Absolute: 0.3 10*3/uL (ref 0.0–0.7)
Eosinophils Relative: 4 % (ref 0.0–5.0)
HCT: 43.8 % (ref 36.0–46.0)
Hemoglobin: 14.2 g/dL (ref 12.0–15.0)
Lymphocytes Relative: 16.8 % (ref 12.0–46.0)
Lymphs Abs: 1.3 10*3/uL (ref 0.7–4.0)
MCHC: 32.5 g/dL (ref 30.0–36.0)
MCV: 98.8 fL (ref 78.0–100.0)
Monocytes Absolute: 0.7 10*3/uL (ref 0.1–1.0)
Monocytes Relative: 9.8 % (ref 3.0–12.0)
Neutro Abs: 5.1 10*3/uL (ref 1.4–7.7)
Neutrophils Relative %: 68.6 % (ref 43.0–77.0)
Platelets: 441 10*3/uL — ABNORMAL HIGH (ref 150.0–400.0)
RBC: 4.43 Mil/uL (ref 3.87–5.11)
RDW: 13.4 % (ref 11.5–15.5)
WBC: 7.5 10*3/uL (ref 4.0–10.5)

## 2023-10-19 LAB — COMPREHENSIVE METABOLIC PANEL
ALT: 20 U/L (ref 0–35)
AST: 20 U/L (ref 0–37)
Albumin: 4.4 g/dL (ref 3.5–5.2)
Alkaline Phosphatase: 50 U/L (ref 39–117)
BUN: 10 mg/dL (ref 6–23)
CO2: 29 meq/L (ref 19–32)
Calcium: 9.9 mg/dL (ref 8.4–10.5)
Chloride: 102 meq/L (ref 96–112)
Creatinine, Ser: 0.81 mg/dL (ref 0.40–1.20)
GFR: 75.54 mL/min (ref >60.00)
Glucose, Bld: 99 mg/dL (ref 70–99)
Potassium: 4 meq/L (ref 3.5–5.1)
Sodium: 140 meq/L (ref 135–145)
Total Bilirubin: 0.6 mg/dL (ref 0.2–1.2)
Total Protein: 7.6 g/dL (ref 6.0–8.3)

## 2023-10-19 LAB — LIPID PANEL
Cholesterol: 244 mg/dL — ABNORMAL HIGH (ref 0–200)
HDL: 75.8 mg/dL (ref >39.00)
LDL Cholesterol: 135 mg/dL — ABNORMAL HIGH (ref 0–99)
NonHDL: 167.78
Total CHOL/HDL Ratio: 3
Triglycerides: 165 mg/dL — ABNORMAL HIGH (ref 0.0–149.0)
VLDL: 33 mg/dL (ref 0.0–40.0)

## 2023-10-19 LAB — HEMOGLOBIN A1C: Hgb A1c MFr Bld: 5.6 % (ref 4.6–6.5)

## 2023-10-19 LAB — TSH: TSH: 5.14 u[IU]/mL (ref 0.35–5.50)

## 2023-10-19 NOTE — Progress Notes (Signed)
Subjective:    Patient ID: Tina Ryan, female    DOB: February 14, 1956, 67 y.o.   MRN: [id number]  Chief Complaint  Patient presents with   Annual Exam    Pt here for annual exam w.o any complaints - fasting    Hypertension   Asthma   Hyperlipidemia     Discussed the use of AI scribe software for clinical note transcription with the patient, who gave verbal consent to proceed.  History of Present Illness   This pleasant 67 yo patient, with a history of hyperlipidemia and diverticulosis, presents for a routine check-up. She reports being generally in good health, with no new symptoms or concerns. She has been proactive about her health, regularly seeing a dermatologist and other specialists. She has had a colonoscopy, mammogram, dental check-up, eye exam, and skin lesion removals within the past year, all of which were unremarkable. She also received the RSV vaccine and flu shot. She has been managing her cholesterol through diet and exercise, and reports that increasing her cholesterol medication dosage in the past has led to debilitating leg cramps. She has a history of diverticulosis, with two episodes of diverticulitis in the past, but has had no recent flare-ups. She has been managing this condition through diet and bowel rest when necessary.       Past Medical History:  Diagnosis Date   Allergy    seasonal & animal triggers   Arthritis 2008   Asthma    Diverticulitis 2009 & 2011   Hx of adenomatous colonic polyps 2004 & 2007   Hyperlipidemia    Hypertension     Past Surgical History:  Procedure Laterality Date   ANTERIOR CRUCIATE LIGAMENT REPAIR  12/13/1993   APPENDECTOMY     BREAST BIOPSY  12/14/2003   CESAREAN SECTION  1986, 1990   colonoscopy with polypectomy     Dr Matthias Hughs   TUBAL LIGATION  1990    Family History  Problem Relation Age of Onset   Hyperlipidemia Mother    Breast cancer Mother    Lung cancer Mother        2 lobectomies ; radiation   Arthritis  Mother    Asthma Mother    Cancer Mother    COPD Mother    Hearing loss Mother    Hypertension Mother    Colon cancer Father    Stroke Father    Cancer Father    Hearing loss Father    Vision loss Father    Birth defects Brother    Valvular heart disease Brother    Stroke Maternal Grandmother    GER disease Son    Heart attack Maternal Aunt 46   Heart attack Paternal Aunt 71       Bullous Pemphigoid; Hamman - Rich Syndrome   Hyperlipidemia Other        5 M aunts   Thyroid disease Other        4 M aunts   Hypertension Other        multiple M aunts    Breast cancer Other        6 M aunts   Diabetes Neg Hx     Social History   Tobacco Use   Smoking status: Never   Smokeless tobacco: Never  Vaping Use   Vaping status: Never Used  Substance Use Topics   Alcohol use: Not Currently    Alcohol/week: 5.0 standard drinks of alcohol    Comment: Wine/beer  occasionally   Drug  use: No     Allergies  Allergen Reactions   Codeine     Vomitting   Sulfa Antibiotics Hives   Lisinopril     cough    Review of Systems NEGATIVE UNLESS OTHERWISE INDICATED IN HPI      Objective:     BP 133/89   Pulse 78   Temp (!) 97.3 F (36.3 C)   Ht 5\' 7"  (1.702 m)   Wt 177 lb 3.2 oz (80.4 kg)   SpO2 97%   BMI 27.75 kg/m   Wt Readings from Last 3 Encounters:  10/19/23 177 lb 3.2 oz (80.4 kg)  10/11/22 169 lb (76.7 kg)  07/26/22 170 lb 3.2 oz (77.2 kg)    BP Readings from Last 3 Encounters:  10/19/23 133/89  10/11/22 132/80  07/26/22 (!) 158/88     Physical Exam Vitals and nursing note reviewed.  Constitutional:      Appearance: Normal appearance. She is normal weight. She is not toxic-appearing.     Comments: Wears glasses  HENT:     Head: Normocephalic and atraumatic.     Right Ear: Tympanic membrane, ear canal and external ear normal.     Left Ear: Tympanic membrane, ear canal and external ear normal.     Nose: Nose normal.     Mouth/Throat:     Mouth: Mucous  membranes are moist.  Eyes:     Extraocular Movements: Extraocular movements intact.     Conjunctiva/sclera: Conjunctivae normal.     Pupils: Pupils are equal, round, and reactive to light.  Cardiovascular:     Rate and Rhythm: Normal rate and regular rhythm.     Pulses: Normal pulses.     Heart sounds: Normal heart sounds.  Pulmonary:     Effort: Pulmonary effort is normal.     Breath sounds: Normal breath sounds.  Abdominal:     General: Abdomen is flat. Bowel sounds are normal.     Palpations: Abdomen is soft.  Musculoskeletal:        General: Normal range of motion.     Cervical back: Normal range of motion and neck supple.  Skin:    General: Skin is warm and dry.  Neurological:     General: No focal deficit present.     Mental Status: She is alert and oriented to person, place, and time.  Psychiatric:        Mood and Affect: Mood normal.        Behavior: Behavior normal.        Thought Content: Thought content normal.        Judgment: Judgment normal.        Assessment & Plan:  Encounter for annual physical exam -     Lipid panel -     Comprehensive metabolic panel -     CBC with Differential/Platelet -     Hemoglobin A1c -     TSH  Osteopenia, unspecified location Assessment & Plan: Noted from bone density Calcium and Vit D all from diet Regularly exercises, does well    Hyperlipidemia, unspecified hyperlipidemia type Assessment & Plan: The 10-year ASCVD risk score (Arnett DK, et al., 2019) is: 8.5%   Values used to calculate the score:     Age: 67 years     Sex: Female     Is Non-Hispanic African American: No     Diabetic: No     Tobacco smoker: No     Systolic Blood Pressure: 133 mmHg  Is BP treated: Yes     HDL Cholesterol: 83.6 mg/dL     Total Cholesterol: 250 mg/dL   Recheck lipid today Lipitor 20 mg daily, cannot tolerate higher doses due to debilitating cramps Consider CT Cardiac calcium scoring, consider Repatha  Orders: -     Lipid  panel  Essential hypertension Assessment & Plan: BP is stable - to goal, Cont amlodipine 5 mg daily   Orders: -     Lipid panel -     Comprehensive metabolic panel -     Hemoglobin A1c  Screening for diabetes mellitus -     Comprehensive metabolic panel -     Hemoglobin A1c  Diverticulosis of colon Assessment & Plan: As reported from patient No recent flare-ups, avoids raspberries      Age-appropriate screening and counseling performed today. Will check labs and call with results. Preventive measures discussed and printed in AVS for patient.   Patient Counseling: [x]   Nutrition: Stressed importance of moderation in sodium/caffeine intake, saturated fat and cholesterol, caloric balance, sufficient intake of fresh fruits, vegetables, and fiber.  [x]   Stressed the importance of regular exercise.   []   Substance Abuse: Discussed cessation/primary prevention of tobacco, alcohol, or other drug use; driving or other dangerous activities under the influence; availability of treatment for abuse.   [x]   Injury prevention: Discussed safety belts, safety helmets, smoke detector, smoking near bedding or upholstery.   []   Sexuality: Discussed sexually transmitted diseases, partner selection, use of condoms, avoidance of unintended pregnancy  and contraceptive alternatives.   [x]   Dental health: Discussed importance of regular tooth brushing, flossing, and dental visits.  [x]   Health maintenance and immunizations reviewed. Please refer to Health maintenance section.       Return in about 1 year (around 10/18/2024) for physical and fasting labs .    Jeremyah Jelley M Keimya Briddell, PA-C

## 2023-10-19 NOTE — Assessment & Plan Note (Signed)
BP is stable - to goal, Cont amlodipine 5 mg daily

## 2023-10-19 NOTE — Assessment & Plan Note (Signed)
Noted from bone density Calcium and Vit D all from diet Regularly exercises, does well

## 2023-10-19 NOTE — Assessment & Plan Note (Signed)
The 10-year ASCVD risk score (Arnett DK, et al., 2019) is: 8.5%   Values used to calculate the score:     Age: 67 years     Sex: Female     Is Non-Hispanic African American: No     Diabetic: No     Tobacco smoker: No     Systolic Blood Pressure: 133 mmHg     Is BP treated: Yes     HDL Cholesterol: 83.6 mg/dL     Total Cholesterol: 250 mg/dL   Recheck lipid today Lipitor 20 mg daily, cannot tolerate higher doses due to debilitating cramps Consider CT Cardiac calcium scoring, consider Repatha

## 2023-10-19 NOTE — Assessment & Plan Note (Signed)
As reported from patient No recent flare-ups, avoids raspberries

## 2023-10-29 ENCOUNTER — Other Ambulatory Visit: Payer: Self-pay | Admitting: Physician Assistant

## 2023-10-29 DIAGNOSIS — E785 Hyperlipidemia, unspecified: Secondary | ICD-10-CM

## 2023-10-29 DIAGNOSIS — I10 Essential (primary) hypertension: Secondary | ICD-10-CM

## 2024-02-28 ENCOUNTER — Other Ambulatory Visit: Payer: Self-pay | Admitting: Obstetrics and Gynecology

## 2024-02-28 DIAGNOSIS — Z1231 Encounter for screening mammogram for malignant neoplasm of breast: Secondary | ICD-10-CM

## 2024-04-09 ENCOUNTER — Ambulatory Visit
Admission: RE | Admit: 2024-04-09 | Discharge: 2024-04-09 | Disposition: A | Discharge status: Home / Self Care | Source: Ambulatory Visit | Attending: Obstetrics and Gynecology | Admitting: Obstetrics and Gynecology

## 2024-04-09 DIAGNOSIS — Z1231 Encounter for screening mammogram for malignant neoplasm of breast: Secondary | ICD-10-CM | POA: Diagnosis not present

## 2024-07-24 ENCOUNTER — Telehealth: Admitting: Nurse Practitioner

## 2024-07-24 DIAGNOSIS — W57XXXA Bitten or stung by nonvenomous insect and other nonvenomous arthropods, initial encounter: Secondary | ICD-10-CM | POA: Diagnosis not present

## 2024-07-24 DIAGNOSIS — S70369A Insect bite (nonvenomous), unspecified thigh, initial encounter: Secondary | ICD-10-CM | POA: Diagnosis not present

## 2024-07-24 MED ORDER — DOXYCYCLINE HYCLATE 100 MG PO TABS: 100.0000 mg | ORAL_TABLET | Freq: Two times a day (BID) | ORAL | 0 refills | Status: DC

## 2024-07-24 NOTE — Progress Notes (Signed)
 E-Visit for Tick Bite  Thank you for describing your tick bite, Here is how we plan to help! Based on the information that you shared with me it looks like you have An infected or complicated tick bite that requires a longer course of antibiotics and will need for you to schedule a follow-up visit with a provider.  In most cases a tick bite is painless and does not itch.  Most tick bites in which the tick is quickly removed do not require prescriptions. Ticks can transmit several diseases if they are infected and remain attacked to your skin. Therefore the length that the tick was attached and any symptoms you have experienced after the bite are import to accurately develop your custom treatment plan. In most cases a single dose of doxycycline  may prevent the development of a more serious condition.  Based on your information I have Provided a home care guide for tick bites and  instructions on when to call for help. and Your symptoms indicate that you need a longer course of antibiotics and a follow up visit with a provider. I have sent doxycycline  100 mg twice a day for 21 days to the pharmacy that you selected. You will need to schedule a follow up visit with your provider. If you do not have a primary care provider you may use our telehealth physicians on the web at [email or web address]  Which ticks  are associated with illness?  The Wood Tick (dog tick) is the size of a watermelon seed and can sometimes transmit [facility] spotted fever and [place]  tick fever.   The Deer Tick (black-legged tick) is between the size of a poppy seed (pin head) and an apple seed, and can sometimes transmit Lyme disease.  A brown to black tick with a white splotch on its back is likely a female Amblyomma americanum ([place] tick). This tick has been associated with Southern Tick Associated illness ( STARI)  Lyme disease has become the most common tick-borne illness in the [place] . The risk of Lyme  disease following a recognized deer tick bite is estimated to be 1%.  The majority of cases of Lyme disease start with a bull's eye rash at the site of the tick bite. The rash can occur days to weeks (typically 7-10 days) after a tick bite. Treatment with antibiotics is indicated if this rash appears. Flu-like symptoms may accompany the rash, including: fever, chills, headaches, muscle aches, and fatigue. Removing ticks promptly may prevent tick borne disease.  What can be used to prevent Tick Bites?  Insect repellant with at leas 20% DEET. Wearing long pants with sock and shoes. Avoiding tall grass and heavily wooded areas. Checking your skin after being outdoors. Shower with a washcloth after outdoor exposures.  HOME CARE ADVICE FOR TICK BITE  Wood Tick Removal:  Use a pair of tweezers and grasp the wood tick close to the skin (on its head). Pull the wood tick straight upward without twisting or crushing it. Maintain a steady pressure until it releases its grip.   If tweezers aren't available, use fingers, a loop of thread around the jaws, or a needle between the jaws for traction.  Note: covering the tick with petroleum jelly, nail polish or rubbing alcohol doesn't work. Neither does touching the tick with a hot or cold object. Tiny Deer Tick Removal:   Needs to be scraped off with a knife blade or credit card edge. Place tick in a sealed container (e.g. glass jar,  zip lock plastic bag), in case your doctor wants to see it. Tick's Head Removal:  If the wood tick's head breaks off in the skin, it must be removed. Clean the skin. Then use a sterile needle to uncover the head and lift it out or scrape it off.  If a very small piece of the head remains, the skin will eventually slough it off. Antibiotic Ointment:  Wash the wound and your hands with soap and water after removal to prevent catching any tick disease.  Apply an over the counter antibiotic ointment (e.g. bacitracin) to the bite  once. Expected Course: Tick bites normally don't itch or hurt. That's why they often go unnoticed. Call Your Doctor If:  You can't remove the tick or the tick's head Fever, a severe head ache, or rash occur in the next 2 weeks Bite begins to look infected Lyme's disease is common in your area You have not had a tetanus in the last 10 years Your current symptoms become worse    MAKE SURE YOU  Understand these instructions. Will watch your condition. Will get help right away if you are not doing well or get worse.    Thank you for choosing an e-visit.  Your e-visit answers were reviewed by a board certified advanced clinical practitioner to complete your personal care plan. Depending upon the condition, your plan could have included both over the counter or prescription medications.  Please review your pharmacy choice. Make sure the pharmacy is open so you can pick up prescription now. If there is a problem, you may contact your provider through [employer] and have the prescription routed to another pharmacy.  Your safety is important to us . If you have drug allergies check your prescription carefully.   For the next 24 hours you can use MyChart to ask questions about today's visit, request a non-urgent call back, or ask for a work or school excuse. You will get an email in the next two days asking about your experience. I hope that your e-visit has been valuable and will speed your recovery.   I spent approximately 5 minutes reviewing the patient's history, current symptoms and coordinating their care today.

## 2024-07-27 ENCOUNTER — Ambulatory Visit: Payer: Self-pay

## 2024-07-27 NOTE — Telephone Encounter (Signed)
 FYI Only or Action Required?: FYI only for provider.  Patient was last seen in primary care on 10/12/2021 by Kip Ade, NP.  Called Nurse Triage reporting Medication Problem and tick bite/infection.  Symptoms began several days ago.  Interventions attempted: Prescription medications: doxycycline .  Symptoms jmz:uprx bite to upper left thigh near groin with redness around bite site gradually improving.  Triage Disposition: See Within 3 Days in Office  Patient/caregiver understands and will follow disposition?: Yes                            Message from [facility] S sent at 07/27/2024 12:57 PM EDT  patient has ticks break out was prescribed medication doxycycline  (VIBRA -TABS) 100 MG tablet started to experience symptoms such as heart burn. Patient would like to know if she can get off the medication due to the heart burns. She has not taking prescription today and feels great   Reason for Disposition  [1] Caller has URGENT medicine question about med that primary care doctor (or NP/PA) or specialist prescribed AND [2] triager unable to answer question  Answer Assessment - Initial Assessment Questions Patient had tick bite (upper left thigh) after doing yard work (states it was attached for less than 2 days), was able to self remove and reports it was not engorged. She states on Tuesday she woke up with large red, raised area to where the bite was. She was out of town in [place] so she did a telehealth visit. She was prescribed doxycyline 100mg  bid x 21 days. She states today is her 4th day on the medication. She states she Google searched the CDC's recommendation and she states according to that it is not appropriate for her to be on antibiotics this long. Patient plans to be out of town 8/18- 8/23. Advised patient to continue the antibiotic per prescriber's orders and also was advised to follow up with primary care. Patient agreeable to acute visit on Monday  morning with Dr Kennyth.  1. NAME of MEDICINE: What medicine(s) are you calling about?     Doxycycline .  2. QUESTION: What is your question? (e.g., double dose of medicine, side effect)     She would like to know if she can stop taking the medication due to side effects and she states she would like to go to the beach on Monday and go out in the sun.  3. PRESCRIBER: Who prescribed the medicine? Reason: if prescribed by specialist, call should be referred to that group.     FNP Kennyth, telehealth.  4. SYMPTOMS: Do you have any symptoms? If Yes, ask: What symptoms are you having?  How bad are the symptoms (e.g., mild, moderate, severe)     She states she is waking up with terrible heartburn. She states she has been on doxycycline  about 40 years ago and she did not tolerate taking the medication due to side effects. She states the tick bite is not raised or warm anymore. She states it is now flat and the redness has reduced from 3 inches to smaller than a quarter.  5. PREGNANCY:  Is there any chance that you are pregnant? When was your last menstrual period?     N/A.  Protocols used: Medication Question Call-A-AH

## 2024-07-30 ENCOUNTER — Ambulatory Visit (INDEPENDENT_AMBULATORY_CARE_PROVIDER_SITE_OTHER): Admitting: Family Medicine

## 2024-07-30 ENCOUNTER — Encounter: Payer: Self-pay | Admitting: Family Medicine

## 2024-07-30 VITALS — BP 125/83 | HR 87 | Temp 97.3°F | Ht 67.0 in | Wt 187.6 lb

## 2024-07-30 DIAGNOSIS — R21 Rash and other nonspecific skin eruption: Secondary | ICD-10-CM

## 2024-07-30 DIAGNOSIS — I10 Essential (primary) hypertension: Secondary | ICD-10-CM

## 2024-07-30 NOTE — Patient Instructions (Signed)
 It was very nice to see you today!  VISIT SUMMARY: Today, you were seen for a tick bite and subsequent rash. You also discussed experiencing heartburn, likely due to the medication you were taking for the tick bite.  YOUR PLAN: TICK BITE: You had a tick bite on your thigh, which you removed, and you took doxycycline  for six days. The rash you have is not typical of Lyme disease, and you have no other symptoms like fever or body aches. -Stop taking doxycycline  -Monitor for any new symptoms such as muscle aches, joint pain, or a bullseye rash. -If you develop any symptoms of Lyme disease, come back for evaluation. -Keep the remaining doxycycline  in case symptoms arise in the future.  HEARTBURN: You have been experiencing heartburn, likely due to the doxycycline . -Continue taking famotidine as needed to relieve heartburn.  Return if symptoms worsen or fail to improve.   Take care, Dr Kennyth  PLEASE NOTE:  If you had any lab tests, please let us  know if you have not heard back within a few days. You may see your results on mychart before we have a chance to review them but we will give you a call once they are reviewed by us .   If we ordered any referrals today, please let us  know if you have not heard from their office within the next week.   If you had any urgent prescriptions sent in today, please check with the pharmacy within an hour of our visit to make sure the prescription was transmitted appropriately.   Please try these tips to maintain a healthy lifestyle:  Eat at least 3 REAL meals and 1-2 snacks per day.  Aim for no more than 5 hours between eating.  If you eat breakfast, please do so within one hour of getting up.   Each meal should contain half fruits/vegetables, one quarter protein, and one quarter carbs (no bigger than a computer mouse)  Cut down on sweet beverages. This includes juice, soda, and sweet tea.   Drink at least 1 glass of water with each meal and aim for  at least 8 glasses per day  Exercise at least 150 minutes every week.

## 2024-07-30 NOTE — Progress Notes (Signed)
   Tina Ryan is a 68 y.o. female who presents today for an office visit.  Assessment/Plan:  Rash  Patient with rash on left upper thigh after tick bite a week ago.  This was likely a local inflammatory/allergic reaction.  This has mostly resolved at this point.  Had lengthy discussion with patient today regarding her doxycycline .  She is experiencing some side effects and is concerned about sun exposure due to upcoming beach trip.  Given that her initial presentation was not consistent with Lyme and her rash is improving would be reasonable for her to discontinue the doxycycline  at this point.  She has already received well beyond a a prophylactic dose for Lyme disease if she were exposed.  She is agreeable to this plan.  She is aware of symptoms to look out for including worsening rash, fevers, chills, myalgias.  We discussed reasons to return to care.  Follow-up as needed.  Essential hypertension At goal today on amlodipine  5 mg daily.    Subjective:  HPI:  See assessment / plan for status of chronic conditions.   Discussed the use of AI scribe software for clinical note transcription with the patient, who gave verbal consent to proceed.  History of Present Illness Tina Ryan is a 68 year old female who presents with a tick bite and subsequent rash.  One week ago, she assisted her daughter with yard work in [place], where she may have been exposed to ticks. On Monday morning, she discovered a tick on her left upper thigh, which she removed using a tick removal 2. By Monday night, the area was burning, and by Tuesday, it was raised and circular.  She scheduled a virtual visit via telehealth at that time and was started on doxycycline .  The raised area on her left upper thigh has improved significantly.  She has not had any fevers, chills, body aches, myalgias, or arthralgia.  No bull's-eye appearing rash.  She experiences heartburn, which she attributes to the doxycycline , and has  started taking famotidine a day or two ago, which has helped alleviate the symptoms.  She does have upcoming beach trip and is worried about sun exposure with being on the doxycycline .         Objective:  Physical Exam: BP 125/83   Pulse 87   Temp (!) 97.3 F (36.3 C) (Temporal)   Ht 5' 7 (1.702 m)   Wt 187 lb 9.6 oz (85.1 kg)   SpO2 97%   BMI 29.38 kg/m   Gen: No acute distress, resting comfortably Skin: Small approximately 1 to 2 mm erythematous lesion on left upper thigh with faintly erythematous spreading rash much improved compared to picture provided. Neuro: Grossly normal, moves all extremities Psych: Normal affect and thought content      Tina Ryan M. Kennyth, MD 07/30/2024 8:10 AM

## 2024-09-17 DIAGNOSIS — L738 Other specified follicular disorders: Secondary | ICD-10-CM | POA: Diagnosis not present

## 2024-09-17 DIAGNOSIS — L821 Other seborrheic keratosis: Secondary | ICD-10-CM | POA: Diagnosis not present

## 2024-09-17 DIAGNOSIS — C44519 Basal cell carcinoma of skin of other part of trunk: Secondary | ICD-10-CM | POA: Diagnosis not present

## 2024-09-17 DIAGNOSIS — L820 Inflamed seborrheic keratosis: Secondary | ICD-10-CM | POA: Diagnosis not present

## 2024-09-17 DIAGNOSIS — D1801 Hemangioma of skin and subcutaneous tissue: Secondary | ICD-10-CM | POA: Diagnosis not present

## 2024-09-17 DIAGNOSIS — C4441 Basal cell carcinoma of skin of scalp and neck: Secondary | ICD-10-CM | POA: Diagnosis not present

## 2024-09-17 DIAGNOSIS — D485 Neoplasm of uncertain behavior of skin: Secondary | ICD-10-CM | POA: Diagnosis not present

## 2024-10-01 ENCOUNTER — Ambulatory Visit

## 2024-10-01 VITALS — Ht 67.0 in | Wt 187.0 lb

## 2024-10-01 DIAGNOSIS — Z0000 Encounter for general adult medical examination without abnormal findings: Secondary | ICD-10-CM

## 2024-10-01 NOTE — Progress Notes (Signed)
 Subjective:   Tina Ryan is a 68 y.o. who presents for a Medicare Wellness preventive visit.  As a reminder, Annual Wellness Visits don't include a physical exam, and some assessments may be limited, especially if this visit is performed virtually. We may recommend an in-person follow-up visit with your provider if needed.  Visit Complete: Virtual I connected with  Taralyn Ferraiolo on 10/01/24 by a audio enabled telemedicine application and verified that I am speaking with the correct person using two identifiers.  Patient Location: Home  Provider Location: Office/Clinic  I discussed the limitations of evaluation and management by telemedicine. The patient expressed understanding and agreed to proceed.  Vital Signs: Because this visit was a virtual/telehealth visit, some criteria may be missing or patient reported. Any vitals not documented were not able to be obtained and vitals that have been documented are patient reported.  VideoDeclined- This patient declined Librarian, academic. Therefore the visit was completed with audio only.  Persons Participating in Visit: Patient.  AWV Questionnaire: Yes: Patient Medicare AWV questionnaire was completed by the patient on 09/27/24; I have confirmed that all information answered by patient is correct and no changes since this date.  Cardiac Risk Factors include: advanced age (>33men, >30 women);dyslipidemia;hypertension     Objective:    Today's Vitals   10/01/24 0804  Weight: 187 lb (84.8 kg)  Height: 5' 7 (1.702 m)   Body mass index is 29.29 kg/m.     10/01/2024    8:20 AM  Advanced Directives  Does Patient Have a Medical Advance Directive? Yes  Type of Estate agent of [place];Living will  Copy of Healthcare Power of Attorney in Chart? No - copy requested    Current Medications (verified) Outpatient Encounter Medications as of 10/01/2024  Medication Sig   amLODipine   (NORVASC ) 5 MG tablet TAKE 1 TABLET DAILY   ascorbic acid (VITAMIN C) 500 MG tablet Take 500 mg by mouth daily.   atorvastatin  (LIPITOR) 20 MG tablet TAKE 1 TABLET DAILY   Cholecalciferol (VITAMIN D ) 50 MCG (2000 UT) tablet Take 2,000 Units by mouth daily.   MAGNESIUM GLYCINATE PO Take by mouth.   Multiple Vitamins-Minerals (MULTIVITAMIN GUMMIES ADULT PO) Take by mouth daily.   albuterol  (VENTOLIN  HFA) 108 (90 Base) MCG/ACT inhaler Inhale 2 puffs into the lungs every 6 (six) hours as needed for wheezing or shortness of breath. (Patient not taking: Reported on 10/01/2024)   [DISCONTINUED] budesonide -formoterol  (SYMBICORT ) 80-4.5 MCG/ACT inhaler INHALE TWO PUFFS BY MOUTH TWICE A DAY   No facility-administered encounter medications on file as of 10/01/2024.    Allergies (verified) Codeine, Sulfa antibiotics, and Lisinopril    History: Past Medical History:  Diagnosis Date   Allergy    seasonal & animal triggers   Arthritis 2008   Asthma    Diverticulitis 2009 & 2011   Hx of adenomatous colonic polyps 2004 & 2007   Hyperlipidemia    Hypertension    Past Surgical History:  Procedure Laterality Date   ANTERIOR CRUCIATE LIGAMENT REPAIR  12/13/1993   APPENDECTOMY     BREAST BIOPSY  12/14/2003   CESAREAN SECTION  1986, 1990   colonoscopy with polypectomy     Dr Donnald   TUBAL LIGATION  1990   Family History  Problem Relation Age of Onset   Hyperlipidemia Mother    Breast cancer Mother    Lung cancer Mother        2 lobectomies ; radiation   Arthritis Mother  Asthma Mother    Cancer Mother    COPD Mother    Hearing loss Mother    Hypertension Mother    Colon cancer Father    Stroke Father    Cancer Father    Hearing loss Father    Vision loss Father    Birth defects Brother    Valvular heart disease Brother    Stroke Maternal Grandmother    GER disease Son    Heart attack Maternal Aunt 46   Heart attack Paternal Aunt 3       Bullous Pemphigoid; Hamman - Rich  Syndrome   Hyperlipidemia Other        5 M aunts   Thyroid  disease Other        4 M aunts   Hypertension Other        multiple M aunts    Breast cancer Other        6 M aunts   Diabetes Neg Hx    Social History   Socioeconomic History   Marital status: Divorced    Spouse name: Not on file   Number of children: Not on file   Years of education: Not on file   Highest education level: Associate degree: academic program  Occupational History   Not on file  Tobacco Use   Smoking status: Never   Smokeless tobacco: Never  Vaping Use   Vaping status: Never Used  Substance and Sexual Activity   Alcohol use: Not Currently    Alcohol/week: 5.0 standard drinks of alcohol    Comment: Wine/beer  occasionally   Drug use: No   Sexual activity: Not Currently  Other Topics Concern   Not on file  Social History Narrative   Not on file   Social Drivers of Health   Financial Resource Strain: Low Risk  (09/27/2024)   Overall Financial Resource Strain (CARDIA)    Difficulty of Paying Living Expenses: Not hard at all  Food Insecurity: No Food Insecurity (09/27/2024)   Hunger Vital Sign    Worried About Running Out of Food in the Last Year: Never true    Ran Out of Food in the Last Year: Never true  Transportation Needs: No Transportation Needs (09/27/2024)   PRAPARE - Administrator, Civil Service (Medical): No    Lack of Transportation (Non-Medical): No  Physical Activity: Insufficiently Active (09/27/2024)   Exercise Vital Sign    Days of Exercise per Week: 3 days    Minutes of Exercise per Session: 30 min  Stress: No Stress Concern Present (09/27/2024)   [employer] of Occupational Health - Occupational Stress Questionnaire    Feeling of Stress: Only a little  Social Connections: Moderately Isolated (09/27/2024)   Social Connection and Isolation Panel    Frequency of Communication with Friends and Family: More than three times a week    Frequency of Social  Gatherings with Friends and Family: Once a week    Attends Religious Services: 1 to 4 times per year    Active Member of [employer] or Organizations: No    Attends Engineer, structural: Not on file    Marital Status: Divorced    Tobacco Counseling Counseling given: Not Answered    Clinical Intake:  Pre-visit preparation completed: Yes  Pain : No/denies pain     BMI - recorded: 29.29 Nutritional Status: BMI 25 -29 Overweight Nutritional Risks: None Diabetes: No  Lab Results  Component Value Date   HGBA1C 5.6 10/19/2023  HGBA1C 5.4 10/12/2021   HGBA1C 5.6 10/09/2020     How often do you need to have someone help you when you read instructions, pamphlets, or other written materials from your doctor or pharmacy?: 1 - Never  Interpreter Needed?: No  Information entered by :: Ellouise Haws, LPN   Activities of Daily Living     09/27/2024   11:18 AM  In your present state of health, do you have any difficulty performing the following activities:  Hearing? 0  Vision? 0  Difficulty concentrating or making decisions? 0  Walking or climbing stairs? 0  Dressing or bathing? 0  Doing errands, shopping? 0  Preparing Food and eating ? N  Using the Toilet? N  In the past six months, have you accidently leaked urine? N  Do you have problems with loss of bowel control? N  Managing your Medications? N  Managing your Finances? N  Housekeeping or managing your Housekeeping? N    Patient Care Team: Allwardt, Alyssa M, PA-C as PCP - General (Physician Assistant) Lilton Legions, DO as Consulting Physician (Obstetrics and Gynecology)  I have updated your Care Teams any recent Medical Services you may have received from other providers in the past year.     Assessment:   This is a routine wellness examination for Laini.  Hearing/Vision screen Hearing Screening - Comments:: Pt denies any hearing issues  Vision Screening - Comments:: Wears rx glasses - up to date  with routine eye exams with [facility] Dr Joshua    Goals Addressed             This Visit's Progress    Patient Stated       Get back to weight loss goal        Depression Screen     10/01/2024    8:17 AM 10/19/2023    8:21 AM 07/26/2022   10:08 AM 10/12/2021    2:36 PM 10/09/2020    8:33 AM 10/09/2019    9:36 AM 07/26/2018    9:47 AM  PHQ 2/9 Scores  PHQ - 2 Score 0 0 0 0 0 0 0  PHQ- 9 Score  0  0  0     Fall Risk     09/27/2024   11:18 AM 10/19/2023    8:21 AM 07/26/2022   10:07 AM 10/12/2021    2:36 PM 10/09/2019    9:36 AM  Fall Risk   Falls in the past year? 0 0 0 0 0   Number falls in past yr: 0 0 0 0 0   Injury with Fall? 0 0 0 0 0  Risk for fall due to : No Fall Risks No Fall Risks No Fall Risks No Fall Risks   Follow up Falls prevention discussed Falls evaluation completed Falls evaluation completed  Falls evaluation completed  Falls evaluation completed      Data saved with a previous flowsheet row definition    MEDICARE RISK AT HOME:  Medicare Risk at Home Any stairs in or around the home?: (Patient-Rptd) Yes If so, are there any without handrails?: (Patient-Rptd) No Home free of loose throw rugs in walkways, pet beds, electrical cords, etc?: (Patient-Rptd) Yes Adequate lighting in your home to reduce risk of falls?: (Patient-Rptd) Yes Life alert?: (Patient-Rptd) No Use of a cane, walker or w/c?: (Patient-Rptd) No Grab bars in the bathroom?: (Patient-Rptd) No Shower chair or bench in shower?: (Patient-Rptd) No Elevated toilet seat or a handicapped toilet?: (Patient-Rptd) No  TIMED UP AND GO:  Was the test performed?  No  Cognitive Function: 6CIT completed        10/01/2024    8:20 AM  6CIT Screen  What Year? 0 points  What month? 0 points  What time? 0 points  Count back from 20 0 points  Months in reverse 0 points  Repeat phrase 0 points  Total Score 0 points    Immunizations Immunization History  Administered Date(s)  Administered   Fluad Quad(high Dose 65+) 08/31/2022   INFLUENZA, HIGH DOSE SEASONAL PF 08/26/2023   Influenza Inj Mdck Quad Pf 12/14/2016   Influenza Inj Mdck Quad With Preservative 09/30/2017, 09/17/2019   Influenza Whole 09/12/2012   Influenza, Quadrivalent, Recombinant, Inj, Pf 08/24/2018, 09/17/2019, 08/31/2021   Influenza,inj,Quad PF,6+ Mos 01/18/2014, 12/14/2016, 09/30/2017, 09/01/2020   Influenza-Unspecified 10/13/2014, 10/14/2015, 08/24/2018, 08/13/2021, 08/31/2021, 08/31/2022   MMR 02/11/1999   PFIZER Comirnaty(Gray Top)Covid-19 Tri-Sucrose Vaccine 04/28/2021   PFIZER(Purple Top)SARS-COV-2 Vaccination 02/15/2020, 03/07/2020, 09/08/2020   PNEUMOCOCCAL CONJUGATE-20 07/26/2022   Pfizer Covid-19 Vaccine Bivalent Booster 56yrs & up 10/15/2021   Pneumococcal Polysaccharide-23 01/16/2013   Respiratory Syncytial Virus Vaccine,Recomb Aduvanted(Arexvy) 09/17/2022, 09/17/2022   Tdap 01/21/2015   Varicella 02/11/1999   Zoster Recombinant(Shingrix) 08/31/2022, 11/02/2022    Screening Tests Health Maintenance  Topic Date Due   COVID-19 Vaccine (6 - 2025-26 season) 08/13/2024   Influenza Vaccine  03/12/2025 (Originally 07/13/2024)   DTaP/Tdap/Td (2 - Td or Tdap) 01/21/2025   Medicare Annual Wellness (AWV)  10/01/2025   Mammogram  04/09/2026   Colonoscopy  12/04/2027   Pneumococcal Vaccine: 50+ Years  Completed   DEXA SCAN  Completed   Hepatitis C Screening  Completed   Zoster Vaccines- Shingrix  Completed   Meningococcal B Vaccine  Aged Out    Health Maintenance Items Addressed: See Nurse Notes at the end of this note  Additional Screening:  Vision Screening: Recommended annual ophthalmology exams for early detection of glaucoma and other disorders of the eye. Is the patient up to date with their annual eye exam?  Yes  Who is the provider or what is the name of the office in which the patient attends annual eye exams? Dr [facility]   Dental Screening: Recommended annual  dental exams for proper oral hygiene  Community Resource Referral / Chronic Care Management: CRR required this visit?  No   CCM required this visit?  No   Plan:    I have personally reviewed and noted the following in the patient's chart:   Medical and social history Use of alcohol, tobacco or illicit drugs  Current medications and supplements including opioid prescriptions. Patient is not currently taking opioid prescriptions. Functional ability and status Nutritional status Physical activity Advanced directives List of other physicians Hospitalizations, surgeries, and ER visits in previous 12 months Vitals Screenings to include cognitive, depression, and falls Referrals and appointments  In addition, I have reviewed and discussed with patient certain preventive protocols, quality metrics, and best practice recommendations. A written personalized care plan for preventive services as well as general preventive health recommendations were provided to patient.   Ellouise VEAR Haws, LPN   [id number]   After Visit Summary: (MyChart) Due to this being a telephonic visit, the after visit summary with patients personalized plan was offered to patient via MyChart   Notes: Nothing significant to report at this time.

## 2024-10-01 NOTE — Patient Instructions (Signed)
 Tina Ryan,  Thank you for taking the time for your Medicare Wellness Visit. I appreciate your continued commitment to your health goals. Please review the care plan we discussed, and feel free to reach out if I can assist you further.  Medicare recommends these wellness visits once per year to help you and your care team stay ahead of potential health issues. These visits are designed to focus on prevention, allowing your provider to concentrate on managing your acute and chronic conditions during your regular appointments.  Please note that Annual Wellness Visits do not include a physical exam. Some assessments may be limited, especially if the visit was conducted virtually. If needed, we may recommend a separate in-person follow-up with your provider.  Ongoing Care Seeing your primary care provider every 3 to 6 months helps us  monitor your health and provide consistent, personalized care.   Referrals If a referral was made during today's visit and you haven't received any updates within two weeks, please contact the referred provider directly to check on the status.  Recommended Screenings:  Health Maintenance  Topic Date Due   Medicare Annual Wellness Visit  10/12/2023   COVID-19 Vaccine (6 - 2025-26 season) 08/13/2024   Flu Shot  03/12/2025*   DTaP/Tdap/Td vaccine (2 - Td or Tdap) 01/21/2025   Breast Cancer Screening  04/09/2026   Colon Cancer Screening  12/04/2027   Pneumococcal Vaccine for age over 9  Completed   DEXA scan (bone density measurement)  Completed   Hepatitis C Screening  Completed   Zoster (Shingles) Vaccine  Completed   Meningitis B Vaccine  Aged Out  *Topic was postponed. The date shown is not the original due date.        No data to display         Advance Care Planning is important because it: Ensures you receive medical care that aligns with your values, goals, and preferences. Provides guidance to your family and loved ones, reducing the emotional  burden of decision-making during critical moments.  Vision: Annual vision screenings are recommended for early detection of glaucoma, cataracts, and diabetic retinopathy. These exams can also reveal signs of chronic conditions such as diabetes and high blood pressure.  Dental: Annual dental screenings help detect early signs of oral cancer, gum disease, and other conditions linked to overall health, including heart disease and diabetes.  Please see the attached documents for additional preventive care recommendations.

## 2024-10-18 DIAGNOSIS — C44519 Basal cell carcinoma of skin of other part of trunk: Secondary | ICD-10-CM | POA: Diagnosis not present

## 2024-10-18 DIAGNOSIS — C4441 Basal cell carcinoma of skin of scalp and neck: Secondary | ICD-10-CM | POA: Diagnosis not present

## 2024-10-18 HISTORY — PX: SKIN CANCER EXCISION: SHX779

## 2024-10-19 ENCOUNTER — Encounter: Payer: Self-pay | Admitting: Physician Assistant

## 2024-10-19 ENCOUNTER — Ambulatory Visit: Payer: Medicare HMO | Admitting: Physician Assistant

## 2024-10-19 VITALS — BP 132/78 | HR 85 | Temp 97.3°F | Ht 67.0 in | Wt 188.6 lb

## 2024-10-19 DIAGNOSIS — Z0000 Encounter for general adult medical examination without abnormal findings: Secondary | ICD-10-CM

## 2024-10-19 DIAGNOSIS — E785 Hyperlipidemia, unspecified: Secondary | ICD-10-CM | POA: Diagnosis not present

## 2024-10-19 DIAGNOSIS — E559 Vitamin D deficiency, unspecified: Secondary | ICD-10-CM

## 2024-10-19 DIAGNOSIS — R7989 Other specified abnormal findings of blood chemistry: Secondary | ICD-10-CM

## 2024-10-19 DIAGNOSIS — I10 Essential (primary) hypertension: Secondary | ICD-10-CM | POA: Diagnosis not present

## 2024-10-19 DIAGNOSIS — D049 Carcinoma in situ of skin, unspecified: Secondary | ICD-10-CM

## 2024-10-19 LAB — COMPREHENSIVE METABOLIC PANEL WITH GFR
ALT: 20 U/L (ref 0–35)
AST: 16 U/L (ref 0–37)
Albumin: 4.6 g/dL (ref 3.5–5.2)
Alkaline Phosphatase: 57 U/L (ref 39–117)
BUN: 11 mg/dL (ref 6–23)
CO2: 29 meq/L (ref 19–32)
Calcium: 9.7 mg/dL (ref 8.4–10.5)
Chloride: 101 meq/L (ref 96–112)
Creatinine, Ser: 0.77 mg/dL (ref 0.40–1.20)
GFR: 79.71 mL/min (ref >60.00)
Glucose, Bld: 103 mg/dL — ABNORMAL HIGH (ref 70–99)
Potassium: 4.5 meq/L (ref 3.5–5.1)
Sodium: 141 meq/L (ref 135–145)
Total Bilirubin: 0.8 mg/dL (ref 0.2–1.2)
Total Protein: 7.3 g/dL (ref 6.0–8.3)

## 2024-10-19 LAB — CBC WITH DIFFERENTIAL/PLATELET
Basophils Absolute: 0.1 K/uL (ref 0.0–0.1)
Basophils Relative: 0.9 % (ref 0.0–3.0)
Eosinophils Absolute: 0.3 K/uL (ref 0.0–0.7)
Eosinophils Relative: 4 % (ref 0.0–5.0)
HCT: 42.9 % (ref 36.0–46.0)
Hemoglobin: 14.3 g/dL (ref 12.0–15.0)
Lymphocytes Relative: 14.7 % (ref 12.0–46.0)
Lymphs Abs: 1.1 K/uL (ref 0.7–4.0)
MCHC: 33.3 g/dL (ref 30.0–36.0)
MCV: 97 fl (ref 78.0–100.0)
Monocytes Absolute: 0.7 K/uL (ref 0.1–1.0)
Monocytes Relative: 9.5 % (ref 3.0–12.0)
Neutro Abs: 5.2 K/uL (ref 1.4–7.7)
Neutrophils Relative %: 70.9 % (ref 43.0–77.0)
Platelets: 353 K/uL (ref 150.0–400.0)
RBC: 4.42 Mil/uL (ref 3.87–5.11)
RDW: 13.5 % (ref 11.5–15.5)
WBC: 7.4 K/uL (ref 4.0–10.5)

## 2024-10-19 LAB — LIPID PANEL
Cholesterol: 231 mg/dL — ABNORMAL HIGH (ref 0–200)
HDL: 80.9 mg/dL (ref >39.00)
LDL Cholesterol: 131 mg/dL — ABNORMAL HIGH (ref 0–99)
NonHDL: 149.73
Total CHOL/HDL Ratio: 3
Triglycerides: 93 mg/dL (ref 0.0–149.0)
VLDL: 18.6 mg/dL (ref 0.0–40.0)

## 2024-10-19 LAB — VITAMIN D 25 HYDROXY (VIT D DEFICIENCY, FRACTURES): VITD: 60.39 ng/mL (ref 30.00–100.00)

## 2024-10-19 NOTE — Progress Notes (Signed)
 Patient ID: Tina Ryan, female    DOB: 1956-10-17, 68 y.o.   MRN: [id number]   Assessment & Plan:  Encounter for annual physical exam -     CBC with Differential/Platelet -     Comprehensive metabolic panel with GFR -     Lipid panel -     VITAMIN D  25 Hydroxy (Vit-D Deficiency, Fractures)  Vitamin D  deficiency -     VITAMIN D  25 Hydroxy (Vit-D Deficiency, Fractures)  Essential hypertension -     Comprehensive metabolic panel with GFR  Hyperlipidemia, unspecified hyperlipidemia type -     Lipid panel  Elevated platelet count -     CBC with Differential/Platelet  Basal cell carcinoma (BCC) in situ of skin      Assessment and Plan Assessment & Plan Adult Wellness Visit Routine adult wellness visit with no acute concerns. Recent stressors include family bereavements and personal health issues. Up to date with flu and COVID vaccinations. - Continue routine wellness visits - Maintain current vaccination schedule  Essential hypertension Blood pressure elevated at 151/94, likely due to recent stress and lack of sleep. - Rechecked blood pressure  Hyperlipidemia Cholesterol levels to be monitored. - Checked cholesterol levels  Vitamin D  deficiency Currently taking vitamin D  2000 IU daily. Previous deficiency noted. - Checked vitamin D  levels  Basal cell carcinoma, status post excision Status post excision of basal cell carcinoma. Reports discomfort and irritation at the excision site, likely due to recent surgery and stress.      Return in about 1 year (around 10/19/2025) for recheck/follow-up.    Subjective:    Chief Complaint  Patient presents with   Medicare Wellness    Well check with PCP     HPI Discussed the use of AI scribe software for clinical note transcription with the patient, who gave verbal consent to proceed.  History of Present Illness Tina Ryan is a 68 year old female who presents for an annual physical exam.  She was  recently diagnosed with basal cell carcinoma after noticing a 'little red dot' that was 'itchy, flaky, weird.' A biopsy confirmed the diagnosis, and she is scheduled for treatment on November 19th, which was moved up. She experiences discomfort from the stitches, noting a tight sensation and burning, especially when moving her neck.  She has a history of hypertension, with a recent blood pressure reading of 151/94, which she attributes to stress and lack of sleep. She usually maintains a diastolic pressure under 70 and brought her blood pressure cuff to monitor her readings.  She has a history of taking statin drugs, which she finds difficult due to muscle-related side effects. Her daughter, a garment/textile technologist, recommended magnesium glycinate gummies, which she takes at night. She has been managing her statin dosage, previously cutting it in half due to side effects.  She experienced significant stress over the past year, including the death of her partner from carcinomatosis, her best friend's diagnosis of small cell lung cancer, and her brother's sudden death from a heart attack. She is managing her brother's affairs without a will, which has been challenging.  She reports a weight gain of 12 pounds but notes a previous weight loss of 50 pounds during COVID. She attributes the recent gain to stress and plans to address it once her current stressors are resolved.  She was treated with doxycycline  for nine days for a skin spot, which caused gastrointestinal discomfort. She also had a negative reaction to tramadol, resulting in prolonged  vomiting.  She is up to date with her vaccinations, having received her flu shot in September. She has had COVID-19 twice, which she contracted from children, and has not received additional boosters since then.     Past Medical History:  Diagnosis Date   Allergy    seasonal & animal triggers   Arthritis 2008   Asthma    Diverticulitis 2009 & 2011   Hx of  adenomatous colonic polyps 2004 & 2007   Hyperlipidemia    Hypertension     Past Surgical History:  Procedure Laterality Date   ANTERIOR CRUCIATE LIGAMENT REPAIR  12/13/1993   APPENDECTOMY     BREAST BIOPSY  12/14/2003   CESAREAN SECTION  1986, 1990   colonoscopy with polypectomy     Dr Donnald   TUBAL LIGATION  1990    Family History  Problem Relation Age of Onset   Hyperlipidemia Mother    Breast cancer Mother    Lung cancer Mother        2 lobectomies ; radiation   Arthritis Mother    Asthma Mother    Cancer Mother    COPD Mother    Hearing loss Mother    Hypertension Mother    Colon cancer Father    Stroke Father    Cancer Father    Hearing loss Father    Vision loss Father    Birth defects Brother    Valvular heart disease Brother    Stroke Maternal Grandmother    GER disease Son    Heart attack Maternal Aunt 46   Heart attack Paternal Aunt 88       Bullous Pemphigoid; Hamman - Rich Syndrome   Hyperlipidemia Other        5 M aunts   Thyroid  disease Other        4 M aunts   Hypertension Other        multiple M aunts    Breast cancer Other        6 M aunts   Diabetes Neg Hx     Social History   Tobacco Use   Smoking status: Never   Smokeless tobacco: Never  Vaping Use   Vaping status: Never Used  Substance Use Topics   Alcohol use: Yes    Alcohol/week: 5.0 standard drinks of alcohol    Comment: Wine/beer  occasionally   Drug use: No     Allergies  Allergen Reactions   Codeine     Vomitting   Sulfa Antibiotics Hives   Tramadol Nausea And Vomiting    Extreme vomiting   Lisinopril      cough    Review of Systems NEGATIVE UNLESS OTHERWISE INDICATED IN HPI      Objective:     BP 132/78   Pulse 85   Temp (!) 97.3 F (36.3 C) (Temporal)   Ht 5' 7 (1.702 m)   Wt 188 lb 9.6 oz (85.5 kg)   SpO2 98%   BMI 29.54 kg/m   Wt Readings from Last 3 Encounters:  10/19/24 188 lb 9.6 oz (85.5 kg)  10/01/24 187 lb (84.8 kg)  07/30/24 187  lb 9.6 oz (85.1 kg)    BP Readings from Last 3 Encounters:  10/19/24 132/78  07/30/24 125/83  10/19/23 133/89     Physical Exam Vitals and nursing note reviewed.  Constitutional:      Appearance: Normal appearance. She is normal weight. She is not toxic-appearing.  HENT:     Head: Normocephalic  and atraumatic.     Right Ear: Tympanic membrane, ear canal and external ear normal.     Left Ear: Tympanic membrane, ear canal and external ear normal.     Nose: Nose normal.     Mouth/Throat:     Mouth: Mucous membranes are moist.  Eyes:     Extraocular Movements: Extraocular movements intact.     Conjunctiva/sclera: Conjunctivae normal.     Pupils: Pupils are equal, round, and reactive to light.  Cardiovascular:     Rate and Rhythm: Normal rate and regular rhythm.     Pulses: Normal pulses.     Heart sounds: Normal heart sounds.  Pulmonary:     Effort: Pulmonary effort is normal.     Breath sounds: Normal breath sounds.  Abdominal:     General: Abdomen is flat. Bowel sounds are normal.     Palpations: Abdomen is soft.  Musculoskeletal:        General: Normal range of motion.     Cervical back: Normal range of motion and neck supple.  Skin:    General: Skin is warm and dry.     Findings: Lesion (bandage over chest covering s/p basal cell removal) present.  Neurological:     General: No focal deficit present.     Mental Status: She is alert and oriented to person, place, and time.  Psychiatric:        Mood and Affect: Mood normal.        Behavior: Behavior normal.        Thought Content: Thought content normal.        Judgment: Judgment normal.             Ledon Weihe M Revel Stellmach, PA-C

## 2024-10-22 ENCOUNTER — Ambulatory Visit: Payer: Self-pay | Admitting: Physician Assistant

## 2024-10-22 DIAGNOSIS — D049 Carcinoma in situ of skin, unspecified: Secondary | ICD-10-CM | POA: Insufficient documentation

## 2024-10-23 DIAGNOSIS — H524 Presbyopia: Secondary | ICD-10-CM | POA: Diagnosis not present

## 2024-10-25 ENCOUNTER — Other Ambulatory Visit: Payer: Self-pay | Admitting: Physician Assistant

## 2024-10-25 DIAGNOSIS — I10 Essential (primary) hypertension: Secondary | ICD-10-CM

## 2024-10-29 ENCOUNTER — Other Ambulatory Visit: Payer: Self-pay | Admitting: Physician Assistant

## 2024-10-29 DIAGNOSIS — E785 Hyperlipidemia, unspecified: Secondary | ICD-10-CM

## 2024-11-22 DIAGNOSIS — Z85828 Personal history of other malignant neoplasm of skin: Secondary | ICD-10-CM | POA: Diagnosis not present

## 2024-11-22 DIAGNOSIS — L570 Actinic keratosis: Secondary | ICD-10-CM | POA: Diagnosis not present

## 2024-12-21 ENCOUNTER — Ambulatory Visit: Admitting: Physician Assistant

## 2024-12-21 ENCOUNTER — Encounter: Payer: Self-pay | Admitting: Physician Assistant

## 2024-12-21 ENCOUNTER — Ambulatory Visit: Payer: Self-pay

## 2024-12-21 VITALS — BP 124/78 | HR 90 | Temp 98.6°F | Resp 14 | Ht 67.0 in | Wt 186.2 lb

## 2024-12-21 DIAGNOSIS — Z8719 Personal history of other diseases of the digestive system: Secondary | ICD-10-CM

## 2024-12-21 DIAGNOSIS — R1032 Left lower quadrant pain: Secondary | ICD-10-CM | POA: Diagnosis not present

## 2024-12-21 LAB — COMPREHENSIVE METABOLIC PANEL WITH GFR
ALT: 27 U/L (ref 3–35)
AST: 23 U/L (ref 5–37)
Albumin: 4.5 g/dL (ref 3.5–5.2)
Alkaline Phosphatase: 61 U/L (ref 39–117)
BUN: 8 mg/dL (ref 6–23)
CO2: 28 meq/L (ref 19–32)
Calcium: 9.7 mg/dL (ref 8.4–10.5)
Chloride: 103 meq/L (ref 96–112)
Creatinine, Ser: 0.74 mg/dL (ref 0.40–1.20)
GFR: 83.5 mL/min
Glucose, Bld: 103 mg/dL — ABNORMAL HIGH (ref 70–99)
Potassium: 3.9 meq/L (ref 3.5–5.1)
Sodium: 140 meq/L (ref 135–145)
Total Bilirubin: 0.5 mg/dL (ref 0.2–1.2)
Total Protein: 8 g/dL (ref 6.0–8.3)

## 2024-12-21 LAB — CBC WITH DIFFERENTIAL/PLATELET
Basophils Absolute: 0.1 K/uL (ref 0.0–0.1)
Basophils Relative: 1.1 % (ref 0.0–3.0)
Eosinophils Absolute: 0.2 K/uL (ref 0.0–0.7)
Eosinophils Relative: 2.1 % (ref 0.0–5.0)
HCT: 43.8 % (ref 36.0–46.0)
Hemoglobin: 14.6 g/dL (ref 12.0–15.0)
Lymphocytes Relative: 12.5 % (ref 12.0–46.0)
Lymphs Abs: 1.5 K/uL (ref 0.7–4.0)
MCHC: 33.4 g/dL (ref 30.0–36.0)
MCV: 96.1 fl (ref 78.0–100.0)
Monocytes Absolute: 1.1 K/uL — ABNORMAL HIGH (ref 0.1–1.0)
Monocytes Relative: 9.2 % (ref 3.0–12.0)
Neutro Abs: 8.9 K/uL — ABNORMAL HIGH (ref 1.4–7.7)
Neutrophils Relative %: 75.1 % (ref 43.0–77.0)
Platelets: 411 K/uL — ABNORMAL HIGH (ref 150.0–400.0)
RBC: 4.56 Mil/uL (ref 3.87–5.11)
RDW: 13.4 % (ref 11.5–15.5)
WBC: 11.8 K/uL — ABNORMAL HIGH (ref 4.0–10.5)

## 2024-12-21 MED ORDER — ONDANSETRON HCL 4 MG PO TABS: 4.0000 mg | ORAL_TABLET | Freq: Three times a day (TID) | ORAL | 0 refills | Status: AC | PRN

## 2024-12-21 MED ORDER — AMOXICILLIN-POT CLAVULANATE 875-125 MG PO TABS: 1.0000 | ORAL_TABLET | Freq: Two times a day (BID) | ORAL | 0 refills | Status: AC

## 2024-12-21 NOTE — Telephone Encounter (Signed)
 FYI Only or Action Required?: FYI only for provider: appointment scheduled on 1/9.  Patient was last seen in primary care on 10/19/2024 by Allwardt, Mardy HERO, PA-C.  Called Nurse Triage reporting Abdominal Pain.  Symptoms began yesterday.  Interventions attempted: Nothing.  Symptoms are: gradually worsening.  Triage Disposition: Home Care  Patient/caregiver understands and will follow disposition?: Yes   Last weekend v/ d , this week yesterday last night  Ll quad pain, nausea bloating,   Copied from CRM #[id number]. Topic: Clinical - Red Word Triage >> Dec 21, 2024  9:20 AM Larissa RAMAN wrote: [facility] that prompted transfer to Nurse Triage: severe abdominal pain Reason for Disposition  [1] MILD-MODERATE pain AND [2] comes and goes (cramps)  Answer Assessment - Initial Assessment Questions 1. LOCATION: Where does it hurt?      LL quad 2. RADIATION: Does the pain shoot anywhere else? (e.g., chest, back)     no 3. ONSET: When did the pain begin? (e.g., minutes, hours or days ago)      yesterday 4. SUDDEN: Gradual or sudden onset?     gradual 5. PATTERN Does the pain come and go, or is it constant?     intermittent 6. SEVERITY: How bad is the pain?  (e.g., Scale 1-10; mild, moderate, or severe)     4/10 7. RECURRENT SYMPTOM: Have you ever had this type of stomach pain before? If Yes, ask: When was the last time? and What happened that time?      yes 8. CAUSE: What do you think is causing the stomach pain? (e.g., gallstones, recent abdominal surgery)     diverticulitis 9. RELIEVING/AGGRAVATING FACTORS: What makes it better or worse? (e.g., antacids, bending or twisting motion, bowel movement)      10. OTHER SYMPTOMS: Do you have any other symptoms? (e.g., back pain, diarrhea, fever, urination pain, vomiting)       V/d last weekend  Protocols used: Abdominal Pain - [facility]

## 2024-12-21 NOTE — Telephone Encounter (Signed)
 Pt seen in office with PCP 12/21/24

## 2024-12-21 NOTE — Progress Notes (Unsigned)
 "   Patient ID: Carmella Kees, female    DOB: 03-19-1956, 69 y.o.   MRN: [id number]   Assessment & Plan:  There are no diagnoses linked to this encounter.    Assessment and Plan Assessment & Plan       No follow-ups on file.    Subjective:    Chief Complaint  Patient presents with   Abdominal Pain    Started about last week. Pain wakes her up in the middle of the night. Possible diverticulitis.    Nausea    Expressed that she had been sick last week and had diarrhea as well.    HPI Discussed the use of AI scribe software for clinical note transcription with the patient, who gave verbal consent to proceed.  History of Present Illness      Past Medical History:  Diagnosis Date   Allergy    seasonal & animal triggers   Arthritis 2008   Asthma    Diverticulitis 2009 & 2011   Hx of adenomatous colonic polyps 2004 & 2007   Hyperlipidemia    Hypertension     Past Surgical History:  Procedure Laterality Date   ANTERIOR CRUCIATE LIGAMENT REPAIR  12/13/1993   APPENDECTOMY     BREAST BIOPSY  12/14/2003   CESAREAN SECTION  1986, 1990   colonoscopy with polypectomy     Dr Donnald   SKIN CANCER EXCISION N/A 10/18/2024   TUBAL LIGATION  1990    Family History  Problem Relation Age of Onset   Hyperlipidemia Mother    Breast cancer Mother    Lung cancer Mother        2 lobectomies ; radiation   Arthritis Mother    Asthma Mother    Cancer Mother    COPD Mother    Hearing loss Mother    Hypertension Mother    Colon cancer Father    Stroke Father    Cancer Father    Hearing loss Father    Vision loss Father    Birth defects Brother    Valvular heart disease Brother    Stroke Maternal Grandmother    GER disease Son    Heart attack Maternal Aunt 46   Heart attack Paternal Aunt 72       Bullous Pemphigoid; Hamman - Rich Syndrome   Hyperlipidemia Other        5 M aunts   Thyroid  disease Other        4 M aunts   Hypertension Other        multiple M  aunts    Breast cancer Other        6 M aunts   Diabetes Neg Hx     Social History[1]   Allergies[2]  Review of Systems NEGATIVE UNLESS OTHERWISE INDICATED IN HPI      Objective:     BP 124/78   Pulse 90   Temp 98.6 F (37 C) (Temporal)   Resp 14   Ht 5' 7 (1.702 m)   Wt 186 lb 3.2 oz (84.5 kg)   SpO2 98%   BMI 29.16 kg/m   Wt Readings from Last 3 Encounters:  12/21/24 186 lb 3.2 oz (84.5 kg)  10/19/24 188 lb 9.6 oz (85.5 kg)  10/01/24 187 lb (84.8 kg)    BP Readings from Last 3 Encounters:  12/21/24 124/78  10/19/24 132/78  07/30/24 125/83     Physical Exam          Towana Stenglein CHRISTELLA  Case Vassell, PA-C    [1]  Social History Tobacco Use   Smoking status: Never   Smokeless tobacco: Never  Vaping Use   Vaping status: Never Used  Substance Use Topics   Alcohol use: Yes    Alcohol/week: 5.0 standard drinks of alcohol    Comment: Wine/beer  occasionally   Drug use: No  [2]  Allergies Allergen Reactions   Codeine Nausea And Vomiting   Sulfa Antibiotics Hives   Lisinopril      cough   Tramadol Nausea And Vomiting    Extreme vomiting   "

## 2024-12-23 ENCOUNTER — Ambulatory Visit: Payer: Self-pay | Admitting: Physician Assistant

## 2024-12-23 NOTE — Patient Instructions (Signed)
" °  VISIT SUMMARY: Today, you were seen for left lower quadrant pain, which is suspected to be acute diverticulitis. This pain started after a recent gastrointestinal illness. You have a history of diverticulitis, with your last flare-up in May 2021.  YOUR PLAN: ACUTE DIVERTICULITIS: You have left lower quadrant pain, bloating, and a recent gastrointestinal infection, which are suspected to be acute diverticulitis. -We have ordered stat labs including a white blood cell count and electrolyte panel. -You have been prescribed Augmentin  for 7 days. This may be extended to 10-14 days if needed. -Increase your fluid intake. -You have been prescribed Zofran  to manage nausea. -Monitor your symptoms and seek further medical attention if you do not improve by next week.                      Contains text generated by [employer]bridge.                                 Contains text generated by [employer]bridge.   "

## 2024-12-24 NOTE — Telephone Encounter (Signed)
 See patient msg as update

## 2025-10-07 ENCOUNTER — Ambulatory Visit

## 2025-10-22 ENCOUNTER — Encounter: Admitting: Physician Assistant
